# Patient Record
Sex: Male | Born: 1970 | Race: Black or African American | Hispanic: No | Smoking: Never smoker
Health system: Southern US, Community
[De-identification: ages and names within clinical notes are randomized; demographics above are authoritative.]

---

## 1999-05-18 ENCOUNTER — Encounter: Payer: Self-pay | Admitting: Emergency Medicine

## 1999-05-18 ENCOUNTER — Emergency Department (HOSPITAL_COMMUNITY): Admission: EM | Admit: 1999-05-18 | Discharge: 1999-05-18 | Payer: Self-pay | Admitting: Emergency Medicine

## 2001-03-05 ENCOUNTER — Encounter: Payer: Self-pay | Admitting: Internal Medicine

## 2001-03-05 ENCOUNTER — Ambulatory Visit (HOSPITAL_COMMUNITY): Admission: RE | Admit: 2001-03-05 | Discharge: 2001-03-05 | Payer: Self-pay | Admitting: Internal Medicine

## 2009-06-30 ENCOUNTER — Encounter: Admission: RE | Admit: 2009-06-30 | Discharge: 2009-06-30 | Payer: Self-pay | Admitting: Occupational Medicine

## 2009-09-02 ENCOUNTER — Encounter
Admission: RE | Admit: 2009-09-02 | Discharge: 2009-09-02 | Payer: Self-pay | Admitting: Physical Medicine and Rehabilitation

## 2009-09-21 ENCOUNTER — Encounter
Admission: RE | Admit: 2009-09-21 | Discharge: 2009-09-21 | Payer: Self-pay | Admitting: Physical Medicine and Rehabilitation

## 2011-12-30 ENCOUNTER — Other Ambulatory Visit: Payer: Self-pay | Admitting: Internal Medicine

## 2011-12-30 DIAGNOSIS — R109 Unspecified abdominal pain: Secondary | ICD-10-CM

## 2012-01-04 ENCOUNTER — Ambulatory Visit
Admission: RE | Admit: 2012-01-04 | Discharge: 2012-01-04 | Disposition: A | Discharge status: Home / Self Care | Payer: 59 | Source: Ambulatory Visit | Attending: Internal Medicine | Admitting: Internal Medicine

## 2012-01-04 DIAGNOSIS — R109 Unspecified abdominal pain: Secondary | ICD-10-CM

## 2012-01-04 MED ORDER — IOHEXOL 300 MG/ML  SOLN
125.0000 mL | Freq: Once | INTRAMUSCULAR | Status: AC | PRN
  Administered 2012-01-04: 125 mL via INTRAVENOUS

## 2012-03-20 ENCOUNTER — Other Ambulatory Visit (HOSPITAL_COMMUNITY): Payer: Self-pay | Admitting: Internal Medicine

## 2012-03-20 ENCOUNTER — Ambulatory Visit (HOSPITAL_COMMUNITY)
Admission: RE | Admit: 2012-03-20 | Discharge: 2012-03-20 | Disposition: A | Discharge status: Home / Self Care | Payer: 59 | Source: Ambulatory Visit | Attending: Internal Medicine | Admitting: Internal Medicine

## 2012-03-20 DIAGNOSIS — M79609 Pain in unspecified limb: Secondary | ICD-10-CM | POA: Insufficient documentation

## 2012-03-20 DIAGNOSIS — M25579 Pain in unspecified ankle and joints of unspecified foot: Secondary | ICD-10-CM

## 2012-03-20 DIAGNOSIS — M7989 Other specified soft tissue disorders: Secondary | ICD-10-CM | POA: Insufficient documentation

## 2015-01-29 ENCOUNTER — Encounter: Payer: Self-pay | Admitting: Internal Medicine

## 2015-01-29 ENCOUNTER — Ambulatory Visit (INDEPENDENT_AMBULATORY_CARE_PROVIDER_SITE_OTHER): Payer: Self-pay | Admitting: Internal Medicine

## 2015-01-29 VITALS — BP 122/80 | HR 82 | Temp 98.0°F | Resp 18 | Ht 75.0 in | Wt 281.0 lb

## 2015-01-29 DIAGNOSIS — M79604 Pain in right leg: Secondary | ICD-10-CM

## 2015-01-29 DIAGNOSIS — M79605 Pain in left leg: Secondary | ICD-10-CM

## 2015-01-29 LAB — CBC WITH DIFFERENTIAL/PLATELET
BASOS ABS: 0 10*3/uL (ref 0.0–0.1)
Basophils Relative: 0 % (ref 0–1)
Eosinophils Absolute: 0.2 10*3/uL (ref 0.0–0.7)
Eosinophils Relative: 5 % (ref 0–5)
HEMATOCRIT: 46.4 % (ref 39.0–52.0)
Hemoglobin: 15.9 g/dL (ref 13.0–17.0)
Lymphocytes Relative: 37 % (ref 12–46)
Lymphs Abs: 1.4 10*3/uL (ref 0.7–4.0)
MCH: 26.3 pg (ref 26.0–34.0)
MCHC: 34.3 g/dL (ref 30.0–36.0)
MCV: 76.7 fL — ABNORMAL LOW (ref 78.0–100.0)
MPV: 9.9 fL (ref 8.6–12.4)
Monocytes Absolute: 0.4 10*3/uL (ref 0.1–1.0)
Monocytes Relative: 10 % (ref 3–12)
NEUTROS ABS: 1.8 10*3/uL (ref 1.7–7.7)
Neutrophils Relative %: 48 % (ref 43–77)
Platelets: 226 10*3/uL (ref 150–400)
RBC: 6.05 MIL/uL — ABNORMAL HIGH (ref 4.22–5.81)
RDW: 14.1 % (ref 11.5–15.5)
WBC: 3.8 10*3/uL — AB (ref 4.0–10.5)

## 2015-01-29 LAB — BASIC METABOLIC PANEL WITH GFR
BUN: 9 mg/dL (ref 6–23)
CALCIUM: 9.8 mg/dL (ref 8.4–10.5)
CHLORIDE: 104 meq/L (ref 96–112)
CO2: 26 mEq/L (ref 19–32)
CREATININE: 1.15 mg/dL (ref 0.50–1.35)
GFR, EST AFRICAN AMERICAN: 88 mL/min
GFR, EST NON AFRICAN AMERICAN: 76 mL/min
Glucose, Bld: 84 mg/dL (ref 70–99)
Potassium: 4.2 mEq/L (ref 3.5–5.3)
Sodium: 137 mEq/L (ref 135–145)

## 2015-01-29 LAB — MAGNESIUM: Magnesium: 1.9 mg/dL (ref 1.5–2.5)

## 2015-01-29 LAB — IRON AND TIBC
%SAT: 42 % (ref 20–55)
Iron: 123 ug/dL (ref 42–165)
TIBC: 296 ug/dL (ref 215–435)
UIBC: 173 ug/dL (ref 125–400)

## 2015-01-29 MED ORDER — CYCLOBENZAPRINE HCL 5 MG PO TABS: 5.0000 mg | ORAL_TABLET | Freq: Three times a day (TID) | ORAL | Status: AC | PRN

## 2015-01-29 NOTE — Progress Notes (Signed)
Subjective:    Patient ID: Ronald Winters, male    DOB: [birth year], 44 y.o.   MRN: [id number]  Leg Pain  Associated symptoms include numbness (tingling in feet occasionally.).   Patient presents to the office for evaluation of bilateral leg pain from the knees down.  He reports that the pain is like a tightness when he is at rest.  He reports that during exercise it is severe enough that it is prevent him from doing stuff.  He reports at night time it is more like a throbbing sensation.  He reports that he normally plays basketball, but he has tried giving it a break and has not had any relief with taking a break.  He has been on the elliptical machine and has tried walking.  He reports that he has not tried any ice or heat and he has not taken any type of medications to help relieve his pain.  No injury that he can think of.  No surgery, immobilization, history of clot, or estrogen use.      He has not been seen since 2014.    He does not take any medications on a regular basis.    Review of Systems  Constitutional: Negative for fever and chills.  Respiratory: Negative for cough, chest tightness and shortness of breath.   Cardiovascular: Positive for chest pain. Negative for palpitations and leg swelling.  Musculoskeletal: Positive for back pain, arthralgias and gait problem.  Neurological: Positive for numbness (tingling in feet occasionally.).       Objective:   Physical Exam  Constitutional: He is oriented to person, place, and time. He appears well-developed and well-nourished. No distress.  HENT:  Head: Normocephalic.  Mouth/Throat: Oropharynx is clear and moist. No oropharyngeal exudate.  Eyes: Conjunctivae are normal. No scleral icterus.  Neck: Normal range of motion. Neck supple. No JVD present. No thyromegaly present.  Cardiovascular: Normal rate, regular rhythm, normal heart sounds and intact distal pulses.   Pulses:      Dorsalis pedis pulses are 2+ on the right side,  and 2+ on the left side.       Posterior tibial pulses are 2+ on the right side, and 2+ on the left side.  No calf tenderness bilaterally.  Negative holmans sign bilaterally.  Pulmonary/Chest: Effort normal and breath sounds normal.  Lymphadenopathy:    He has no cervical adenopathy.  Neurological: He is alert and oriented to person, place, and time.  Skin: Skin is warm and dry. He is not diaphoretic.  Psychiatric: He has a normal mood and affect. His behavior is normal. Judgment and thought content normal.  Nursing note and vitals reviewed.       Assessment & Plan:    1. Bilateral leg pain -Hx of anemia and no longer on iron.  Question of iron deficiency anemia.  Patient with negative wells criteria.  Doubt currently that this is bilateral dvts.  Will try flexeril until we can see whether this is related to iron deficiency vs. Electrolyte imbalance. Recommended that the patient start taking 250 mg magnesium daily and drink plenty of water.  Less likely claudication at this point as it is painful all the time.  Good pulses on exam as well.  - CBC with Differential/Platelet - BASIC METABOLIC PANEL WITH GFR - Magnesium - Iron and TIBC - cyclobenzaprine (FLEXERIL) 5 MG tablet; Take 1 tablet (5 mg total) by mouth every 8 (eight) hours as needed for muscle spasms.  Dispense: 30 tablet; Refill:  1    

## 2015-01-29 NOTE — Patient Instructions (Signed)

## 2015-01-31 ENCOUNTER — Emergency Department (HOSPITAL_COMMUNITY): Payer: Self-pay

## 2015-01-31 ENCOUNTER — Emergency Department (HOSPITAL_COMMUNITY)
Admission: EM | Admit: 2015-01-31 | Discharge: 2015-01-31 | Disposition: A | Discharge status: Home / Self Care | Payer: Self-pay | Attending: Emergency Medicine | Admitting: Emergency Medicine

## 2015-01-31 ENCOUNTER — Encounter (HOSPITAL_COMMUNITY): Payer: Self-pay | Admitting: *Deleted

## 2015-01-31 DIAGNOSIS — M25519 Pain in unspecified shoulder: Secondary | ICD-10-CM

## 2015-01-31 DIAGNOSIS — R61 Generalized hyperhidrosis: Secondary | ICD-10-CM | POA: Insufficient documentation

## 2015-01-31 DIAGNOSIS — M79604 Pain in right leg: Secondary | ICD-10-CM | POA: Insufficient documentation

## 2015-01-31 DIAGNOSIS — M25512 Pain in left shoulder: Secondary | ICD-10-CM | POA: Insufficient documentation

## 2015-01-31 DIAGNOSIS — M79605 Pain in left leg: Secondary | ICD-10-CM | POA: Insufficient documentation

## 2015-01-31 DIAGNOSIS — R079 Chest pain, unspecified: Secondary | ICD-10-CM

## 2015-01-31 DIAGNOSIS — R110 Nausea: Secondary | ICD-10-CM | POA: Insufficient documentation

## 2015-01-31 LAB — CBC
HEMATOCRIT: 47.5 % (ref 39.0–52.0)
HEMOGLOBIN: 15.9 g/dL (ref 13.0–17.0)
MCH: 26.4 pg (ref 26.0–34.0)
MCHC: 33.5 g/dL (ref 30.0–36.0)
MCV: 78.8 fL (ref 78.0–100.0)
Platelets: 196 10*3/uL (ref 150–400)
RBC: 6.03 MIL/uL — ABNORMAL HIGH (ref 4.22–5.81)
RDW: 13.1 % (ref 11.5–15.5)
WBC: 4.4 10*3/uL (ref 4.0–10.5)

## 2015-01-31 LAB — I-STAT TROPONIN, ED
Troponin i, poc: 0 ng/mL (ref 0.00–0.08)
Troponin i, poc: 0 ng/mL (ref 0.00–0.08)

## 2015-01-31 LAB — BASIC METABOLIC PANEL
ANION GAP: 9 (ref 5–15)
BUN: 10 mg/dL (ref 6–20)
CHLORIDE: 106 mmol/L (ref 101–111)
CO2: 24 mmol/L (ref 22–32)
CREATININE: 1.23 mg/dL (ref 0.61–1.24)
Calcium: 9.6 mg/dL (ref 8.9–10.3)
GFR calc non Af Amer: >60 mL/min (ref >60)
Glucose, Bld: 113 mg/dL — ABNORMAL HIGH (ref 65–99)
POTASSIUM: 4.1 mmol/L (ref 3.5–5.1)
Sodium: 139 mmol/L (ref 135–145)

## 2015-01-31 LAB — D-DIMER, QUANTITATIVE: D-Dimer, Quant: 0.45 ug/mL-FEU (ref 0.00–0.48)

## 2015-01-31 MED ORDER — HYDROCODONE-ACETAMINOPHEN 5-325 MG PO TABS
2.0000 | ORAL_TABLET | Freq: Once | ORAL | Status: DC
  Filled 2015-01-31: qty 2

## 2015-01-31 MED ORDER — HYDROCODONE-ACETAMINOPHEN 5-325 MG PO TABS: 1.0000 | ORAL_TABLET | Freq: Four times a day (QID) | ORAL | Status: DC | PRN

## 2015-01-31 NOTE — ED Notes (Signed)
Pt reports multiple complaints, reports bilateral calf pain, tingling to feet and occ chest pains for over the past month. Today is having shoulder pain and nausea.

## 2015-01-31 NOTE — Discharge Instructions (Signed)
Acromioclavicular Injuries °The AC (acromioclavicular) joint is the joint in the shoulder where the collarbone (clavicle) meets the shoulder blade (scapula). The part of the shoulder blade connected to the collarbone is called the acromion. Common problems with and treatments for the AC joint are detailed below. °ARTHRITIS °Arthritis occurs when the joint has been injured and the smooth padding between the joints (cartilage) is lost. This is the wear and tear seen in most joints of the body if they have been overused. This causes the joint to produce pain and swelling which is worse with activity.  °AC JOINT SEPARATION °AC joint separation means that the ligaments connecting the acromion of the shoulder blade and collarbone have been damaged, and the two bones no longer line up. AC separations can be anywhere from mild to severe, and are "graded" depending upon which ligaments are torn and how badly they are torn. °· Grade I Injury: the least damage is done, and the AC joint still lines up. °· Grade II Injury: damage to the ligaments which reinforce the AC joint. In a Grade II injury, these ligaments are stretched but not entirely torn. When stressed, the AC joint becomes painful and unstable. °· Grade III Injury: AC and secondary ligaments are completely torn, and the collarbone is no longer attached to the shoulder blade. This results in deformity; a prominence of the end of the clavicle. °AC JOINT FRACTURE °AC joint fracture means that there has been a break in the bones of the AC joint, usually the end of the clavicle. °TREATMENT °TREATMENT OF AC ARTHRITIS °· There is currently no way to replace the cartilage damaged by arthritis. The best way to improve the condition is to decrease the activities which aggravate the problem. Application of ice to the joint helps decrease pain and soreness (inflammation). The use of non-steroidal anti-inflammatory medication is helpful. °· If less conservative measures do not  work, then cortisone shots (injections) may be used. These are anti-inflammatories; they decrease the soreness in the joint and swelling. °· If non-surgical measures fail, surgery may be recommended. The procedure is generally removal of a portion of the end of the clavicle. This is the part of the collarbone closest to your acromion which is stabilized with ligaments to the acromion of the shoulder blade. This surgery may be performed using a tube-like instrument with a light (arthroscope) for looking into a joint. It may also be performed as an open surgery through a small incision by the surgeon. Most patients will have good range of motion within 6 weeks and may return to all activity including sports by 8-12 weeks, barring complications. °TREATMENT OF AN AC SEPARATION °· The initial treatment is to decrease pain. This is best accomplished by immobilizing the arm in a sling and placing an ice pack to the shoulder for 20 to 30 minutes every 2 hours as needed. As the pain starts to subside, it is important to begin moving the fingers, wrist, elbow and eventually the shoulder in order to prevent a stiff or "frozen" shoulder. Instruction on when and how much to move the shoulder will be provided by your caregiver. The length of time needed to regain full motion and function depends on the amount or grade of the injury. Recovery from a Grade I AC separation usually takes 10 to 14 days, whereas a Grade III may take 6 to 8 weeks. °· Grade I and II separations usually do not require surgery. Even Grade III injuries usually allow return to full   activity with few restrictions. Treatment is also based on the activity demands of the injured shoulder. For example, a high level quarterback with an injured throwing arm will receive more aggressive treatment than someone with a desk job who rarely uses his/her arm for strenuous activities. In some cases, a painful lump may persist which could require a later surgery. Surgery  can be very successful, but the benefits must be weighed against the potential risks. TREATMENT OF AN AC JOINT FRACTURE Fracture treatment depends on the type of fracture. Sometimes a splint or sling may be all that is required. Other times surgery may be required for repair. This is more frequently the case when the ligaments supporting the clavicle are completely torn. Your caregiver will help you with these decisions and together you can decide what will be the best treatment. HOME CARE INSTRUCTIONS   Apply ice to the injury for 15-20 minutes each hour while awake for 2 days. Put the ice in a plastic bag and place a towel between the bag of ice and skin.  If a sling has been applied, wear it constantly for as long as directed by your caregiver, even at night. The sling or splint can be removed for bathing or showering or as directed. Be sure to keep the shoulder in the same place as when the sling is on. Do not lift the arm.  If a figure-of-eight splint has been applied it should be tightened gently by another person every day. Tighten it enough to keep the shoulders held back. Allow enough room to place the index finger between the body and strap. Loosen the splint immediately if there is numbness or tingling in the hands.  Take over-the-counter or prescription medicines for pain, discomfort or fever as directed by your caregiver.  If you or your child has received a follow up appointment, it is very important to keep that appointment in order to avoid long term complications, chronic pain or disability. SEEK MEDICAL CARE IF:   The pain is not relieved with medications.  There is increased swelling or discoloration that continues to get worse rather than better.  You or your child has been unable to follow up as instructed.  There is progressive numbness and tingling in the arm, forearm or hand. SEEK IMMEDIATE MEDICAL CARE IF:   The arm is numb, cold or pale.  There is increasing pain  in the hand, forearm or fingers. MAKE SURE YOU:   Understand these instructions.  Will watch your condition.  Will get help right away if you are not doing well or get worse. Document Released: 04/13/2005 Document Revised: 09/26/2011 Document Reviewed: 10/06/2008 [facility] Patient Information 2015 [place], [place]. This information is not intended to replace advice given to you by your health care provider. Make sure you discuss any questions you have with your health care provider. Chest Pain (Nonspecific) It is often hard to give a specific diagnosis for the cause of chest pain. There is always a chance that your pain could be related to something serious, such as a heart attack or a blood clot in the lungs. You need to follow up with your health care provider for further evaluation. CAUSES   Heartburn.  Pneumonia or bronchitis.  Anxiety or stress.  Inflammation around your heart (pericarditis) or lung (pleuritis or pleurisy).  A blood clot in the lung.  A collapsed lung (pneumothorax). It can develop suddenly on its own (spontaneous pneumothorax) or from trauma to the chest.  Shingles infection (herpes  zoster virus). The chest wall is composed of bones, muscles, and cartilage. Any of these can be the source of the pain.  The bones can be bruised by injury.  The muscles or cartilage can be strained by coughing or overwork.  The cartilage can be affected by inflammation and become sore (costochondritis). DIAGNOSIS  Lab tests or other studies may be needed to find the cause of your pain. Your health care provider may have you take a test called an ambulatory electrocardiogram (ECG). An ECG records your heartbeat patterns over a 24-hour period. You may also have other tests, such as:  Transthoracic echocardiogram (TTE). During echocardiography, sound waves are used to evaluate how blood flows through your heart.  Transesophageal echocardiogram (TEE).  Cardiac monitoring. This  allows your health care provider to monitor your heart rate and rhythm in real time.  Holter monitor. This is a portable device that records your heartbeat and can help diagnose heart arrhythmias. It allows your health care provider to track your heart activity for several days, if needed.  Stress tests by exercise or by giving medicine that makes the heart beat faster. TREATMENT   Treatment depends on what may be causing your chest pain. Treatment may include:  Acid blockers for heartburn.  Anti-inflammatory medicine.  Pain medicine for inflammatory conditions.  Antibiotics if an infection is present.  You may be advised to change lifestyle habits. This includes stopping smoking and avoiding alcohol, caffeine, and chocolate.  You may be advised to keep your head raised (elevated) when sleeping. This reduces the chance of acid going backward from your stomach into your esophagus. Most of the time, nonspecific chest pain will improve within 2-3 days with rest and mild pain medicine.  HOME CARE INSTRUCTIONS   If antibiotics were prescribed, take them as directed. Finish them even if you start to feel better.  For the next few days, avoid physical activities that bring on chest pain. Continue physical activities as directed.  Do not use any tobacco products, including cigarettes, chewing tobacco, or electronic cigarettes.  Avoid drinking alcohol.  Only take medicine as directed by your health care provider.  Follow your health care provider's suggestions for further testing if your chest pain does not go away.  Keep any follow-up appointments you made. If you do not go to an appointment, you could develop lasting (chronic) problems with pain. If there is any problem keeping an appointment, call to reschedule. SEEK MEDICAL CARE IF:   Your chest pain does not go away, even after treatment.  You have a rash with blisters on your chest.  You have a fever. SEEK IMMEDIATE MEDICAL  CARE IF:   You have increased chest pain or pain that spreads to your arm, neck, jaw, back, or abdomen.  You have shortness of breath.  You have an increasing cough, or you cough up blood.  You have severe back or abdominal pain.  You feel nauseous or vomit.  You have severe weakness.  You faint.  You have chills. This is an emergency. Do not wait to see if the pain will go away. Get medical help at once. Call your local emergency services (911 in [place] Do not drive yourself to the hospital. MAKE SURE YOU:   Understand these instructions.  Will watch your condition.  Will get help right away if you are not doing well or get worse. Document Released: 04/13/2005 Document Revised: 07/09/2013 Document Reviewed: 02/07/2008 [facility] Patient Information 2015 [place], [place]. This information is not  intended to replace advice given to you by your health care provider. Make sure you discuss any questions you have with your health care provider. ° °

## 2015-01-31 NOTE — ED Notes (Signed)
Pt reporting intermittent tingling to bilateral feet and swollen knees for the past several months. Pt also endorses that his elbows have "started doing the same thing" Pt reports intermittent CP x 1 month with nausea and diaphoresis starting today. Pt in no CP at this time.

## 2015-01-31 NOTE — ED Provider Notes (Signed)
CSN: [id number]     Arrival date & time 01/31/15  1336 History   First MD Initiated Contact with Patient 01/31/15 1500     Chief Complaint  Patient presents with  . Chest Pain  . Leg Pain     (Consider location/radiation/quality/duration/timing/severity/associated sxs/prior Treatment) HPI Comments: Patient presents to the emergency department with multiple complaints.  1. Patient complains of intermittent chest pain times several months. He denies any aggravating or alleviating factors. Denies any exertional symptoms. Denies any associated shortness of breath. States that he has had some associated nausea and diaphoresis which started today. He has no history of heart problems. Denies any history of DVT or PE. He states that he has had some bilateral calf pain, and reports a recent trip to [place]. He states that he did have some calf pain prior to his vacation.  2. Patient complaints of left shoulder pain. States he feels like his left shoulder popped out of socket today. States that he was lifting his groceries when this happened. States that he felt it pop back in. He states the pain is moderate. It is tender to palpation.  The history is provided by the patient. No language interpreter was used.    History reviewed. No pertinent past medical history. History reviewed. No pertinent past surgical history. History reviewed. No pertinent family history. History  Substance Use Topics  . Smoking status: Never Smoker   . Smokeless tobacco: Not on file  . Alcohol Use: No    Review of Systems  Constitutional: Negative for fever and chills.  Respiratory: Negative for shortness of breath.   Cardiovascular: Positive for chest pain.  Gastrointestinal: Positive for nausea. Negative for vomiting, diarrhea and constipation.  Genitourinary: Negative for dysuria.  Musculoskeletal: Positive for myalgias and arthralgias.  All other systems reviewed and are negative.     Allergies   Review of patient's allergies indicates no known allergies.  Home Medications   Prior to Admission medications   Medication Sig Start Date End Date Taking? Authorizing Provider  cyclobenzaprine (FLEXERIL) 5 MG tablet Take 1 tablet (5 mg total) by mouth every 8 (eight) hours as needed for muscle spasms. 01/29/15 01/29/16  Courtney Forcucci, PA-C   BP 142/84 mmHg  Pulse 97  Temp(Src) 98.7 F (37.1 C) (Oral)  Resp 20  Ht 6\' 3"  (1.905 m)  SpO2 95% Physical Exam  Constitutional: He is oriented to person, place, and time. He appears well-developed and well-nourished.  HENT:  Head: Normocephalic and atraumatic.  Eyes: Conjunctivae and EOM are normal. Pupils are equal, round, and reactive to light. Right eye exhibits no discharge. Left eye exhibits no discharge. No scleral icterus.  Neck: Normal range of motion. Neck supple. No JVD present.  Cardiovascular: Normal rate, regular rhythm and normal heart sounds.  Exam reveals no gallop and no friction rub.   No murmur heard. Pulmonary/Chest: Effort normal and breath sounds normal. No respiratory distress. He has no wheezes. He has no rales. He exhibits no tenderness.  Abdominal: Soft. He exhibits no distension and no mass. There is no tenderness. There is no rebound and no guarding.  Musculoskeletal: Normal range of motion. He exhibits no edema or tenderness.  Neurological: He is alert and oriented to person, place, and time.  Skin: Skin is warm and dry.  Psychiatric: He has a normal mood and affect. His behavior is normal. Judgment and thought content normal.  Nursing note and vitals reviewed.   ED Course  Procedures (including critical care time) Labs  Review Labs Reviewed  BASIC METABOLIC PANEL - Abnormal; Notable for the following:    Glucose, Bld 113 (*)    All other components within normal limits  CBC - Abnormal; Notable for the following:    RBC 6.03 (*)    All other components within normal limits  D-DIMER, QUANTITATIVE (NOT AT  [facility])  Randolm Idol, ED    Imaging Review Dg Chest 2 View  01/31/2015   CLINICAL DATA:  44 year old male with left chest pain intermittently for the past 2 months.  EXAM: CHEST  2 VIEW  COMPARISON:  None.  FINDINGS: The lungs are clear and negative for focal airspace consolidation, pulmonary edema or suspicious pulmonary nodule. No pleural effusion or pneumothorax. Cardiac and mediastinal contours are within normal limits. No acute fracture or lytic or blastic osseous lesions. Dextro convex scoliosis of the thoracic spine centered at T8-T9. The visualized upper abdominal bowel gas pattern is unremarkable.  IMPRESSION: Negative chest x-ray.   Electronically Signed   By: Jacqulynn Cadet M.D.   On: 01/31/2015 14:54     EKG Interpretation   Date/Time:  Saturday January 31 2015 13:49:17 EDT Ventricular Rate:  101 PR Interval:  208 QRS Duration: 90 QT Interval:  314 QTC Calculation: 407 R Axis:   61 Text Interpretation:  Sinus tachycardia Nonspecific T wave abnormality  Abnormal ECG No previous ECGs available Confirmed by Christy Gentles  MD, Elenore Rota  [phone]) on 01/31/2015 2:48:11 PM      MDM   Final diagnoses:  Shoulder pain  Chest pain, unspecified chest pain type    Patient with left shoulder pain. No mechanism of injury. Plain films are negative. Will give shoulder sling, and recommend orthopedic follow-up. He is also had intermittent chest pain times several months. 2 troponins are negative today, he has negative d-dimer. He does have nonspecific T-wave changes, which were discussed with patient, and the patient states that he has cardiology follow-up being arranged by his primary care provider. Patient is chest pain-free. He does not have any shortness breath. Will discharge to home with primary care follow-up. Patient is stable and ready for discharge.    Montine Circle, PA-C 01/31/15 [place], DO 02/03/15 2210

## 2015-02-02 ENCOUNTER — Telehealth: Payer: Self-pay | Admitting: *Deleted

## 2015-02-02 NOTE — Telephone Encounter (Signed)
Patient called with Ronald Winters that he went to the ED 01/31/15.  He states he was lifting his groceries in to his car and felt his left shoulder "pop."  Patient states he was concerned that he maybe needs to see an Ortho doc, however, patient does not have insurance currently.  He is requesting to talk to you.

## 2015-02-03 ENCOUNTER — Telehealth: Payer: Self-pay | Admitting: Internal Medicine

## 2015-02-03 ENCOUNTER — Other Ambulatory Visit: Payer: Self-pay | Admitting: Internal Medicine

## 2015-02-03 DIAGNOSIS — R9431 Abnormal electrocardiogram [ECG] [EKG]: Secondary | ICD-10-CM

## 2015-02-03 DIAGNOSIS — M25512 Pain in left shoulder: Secondary | ICD-10-CM

## 2015-02-03 MED ORDER — PREDNISONE 20 MG PO TABS: ORAL_TABLET | ORAL | Status: DC

## 2015-02-03 NOTE — Telephone Encounter (Signed)
Called patient back regarding call that he made yesterday. Patient was concerned about his recent visit to the ER.  Patient did have some chest pain and left shoulder pain which made him concerned and he went to be evaluated.  Two cardiac enzymes were normal, and there were no abnormalities on labs or chest xray.  Patient did have some minor t wave abnormalities that were non-specific but no prior ekgs were available to compare to.  Patient complains that shoulder is still stiff and bothering him.  He occasionally has a burning sensation to the arm.  Patient did have normal xray and was given a sling.  He feels that it is doing better.  I have discussed that this may possibly be an inflamed nerve root in his neck given radiculopathy like symptoms.  Will try course of prednisone.  If no relief patient to be seen in the office.  Will send in referral to cardiology.

## 2015-02-14 ENCOUNTER — Encounter (HOSPITAL_COMMUNITY): Payer: Self-pay | Admitting: Emergency Medicine

## 2015-02-14 ENCOUNTER — Emergency Department (HOSPITAL_COMMUNITY): Payer: Self-pay

## 2015-02-14 ENCOUNTER — Emergency Department (HOSPITAL_COMMUNITY)
Admission: EM | Admit: 2015-02-14 | Discharge: 2015-02-15 | Disposition: A | Discharge status: Home / Self Care | Payer: Self-pay | Attending: Emergency Medicine | Admitting: Emergency Medicine

## 2015-02-14 DIAGNOSIS — Z79899 Other long term (current) drug therapy: Secondary | ICD-10-CM | POA: Insufficient documentation

## 2015-02-14 DIAGNOSIS — R079 Chest pain, unspecified: Secondary | ICD-10-CM | POA: Insufficient documentation

## 2015-02-14 LAB — BASIC METABOLIC PANEL
Anion gap: 6 (ref 5–15)
BUN: 10 mg/dL (ref 6–20)
CALCIUM: 9.3 mg/dL (ref 8.9–10.3)
CO2: 26 mmol/L (ref 22–32)
Chloride: 104 mmol/L (ref 101–111)
Creatinine, Ser: 1.24 mg/dL (ref 0.61–1.24)
GFR calc non Af Amer: >60 mL/min (ref >60)
GLUCOSE: 115 mg/dL — AB (ref 65–99)
Potassium: 3.9 mmol/L (ref 3.5–5.1)
Sodium: 136 mmol/L (ref 135–145)

## 2015-02-14 LAB — I-STAT TROPONIN, ED: TROPONIN I, POC: 0 ng/mL (ref 0.00–0.08)

## 2015-02-14 LAB — CBC
HEMATOCRIT: 45.7 % (ref 39.0–52.0)
Hemoglobin: 15.5 g/dL (ref 13.0–17.0)
MCH: 26.5 pg (ref 26.0–34.0)
MCHC: 33.9 g/dL (ref 30.0–36.0)
MCV: 78.1 fL (ref 78.0–100.0)
Platelets: 224 10*3/uL (ref 150–400)
RBC: 5.85 MIL/uL — ABNORMAL HIGH (ref 4.22–5.81)
RDW: 12.9 % (ref 11.5–15.5)
WBC: 5.5 10*3/uL (ref 4.0–10.5)

## 2015-02-14 MED ORDER — GI COCKTAIL ~~LOC~~
30.0000 mL | Freq: Once | ORAL | Status: AC
  Administered 2015-02-15: 30 mL via ORAL
  Filled 2015-02-14: qty 30

## 2015-02-14 NOTE — ED Provider Notes (Signed)
CSN: [id number]   Arrival date & time 02/14/15 2249  History  This chart was scribed for  Ronald Freiberg, MD by Altamease Oiler, ED Scribe. This patient was seen in room D31C/D31C and the patient's care was started at 11:45 PM.  Chief Complaint  Patient presents with  . Chest Pain    HPI The history is provided by the patient. No language interpreter was used.   Ronald Winters is a 44 y.o. male who presents to the Emergency Department complaining of an episode of chest pain with onset 1.5 hours ago while in bed. The pain is described as pressure with no modifying factors. The episode lasted for 15-30 minutes. Pt is pain free at present.  He had similar pain 2 weeks ago and notes that he is working on cardiology follow up. No nausea,vomiting, SOB, cough, congestion, sore throat, diarrhea, constipation, or fever. His father had cardiac disease before age 65. No recent surgery or travel.   History reviewed. No pertinent past medical history.  History reviewed. No pertinent past surgical history.  No family history on file.  History  Substance Use Topics  . Smoking status: Never Smoker   . Smokeless tobacco: Not on file  . Alcohol Use: No     Review of Systems  Constitutional: Negative for fever.  HENT: Negative for congestion.   Respiratory: Negative for cough and shortness of breath.   Cardiovascular: Positive for chest pain. Negative for leg swelling.  Gastrointestinal: Negative for nausea, vomiting, diarrhea and constipation.  All other systems reviewed and are negative.    Home Medications   Prior to Admission medications   Medication Sig Start Date End Date Taking? Authorizing Provider  cyclobenzaprine (FLEXERIL) 5 MG tablet Take 1 tablet (5 mg total) by mouth every 8 (eight) hours as needed for muscle spasms. Patient not taking: Reported on 02/14/2015 01/29/15 01/29/16  Starlyn Skeans, PA-C  HYDROcodone-acetaminophen (NORCO/VICODIN) 5-325 MG per tablet Take 1-2 tablets by  mouth every 6 (six) hours as needed. Patient not taking: Reported on 02/14/2015 01/31/15   Montine Circle, PA-C  predniSONE (DELTASONE) 20 MG tablet 3 tabs po day one, then 2 tabs daily x 4 days Patient not taking: Reported on 02/14/2015 02/03/15   Starlyn Skeans, PA-C    Allergies  Review of patient's allergies indicates no known allergies.  Triage Vitals: BP 126/83 mmHg  Pulse 81  Temp(Src) 98.6 F (37 C) (Oral)  Resp 16  SpO2 95%  Physical Exam  Constitutional: He is oriented to person, place, and time. He appears well-developed and well-nourished.  HENT:  Head: Normocephalic and atraumatic.  Eyes: Conjunctivae and EOM are normal.  Neck: Normal range of motion. Neck supple.  Cardiovascular: Normal rate, regular rhythm and normal heart sounds.   Pulmonary/Chest: Effort normal and breath sounds normal. No respiratory distress.  Abdominal: He exhibits no distension. There is no tenderness. There is no rebound and no guarding.  Musculoskeletal: Normal range of motion.  Neurological: He is alert and oriented to person, place, and time.  Skin: Skin is warm and dry.  Vitals reviewed.   ED Course  Procedures   DIAGNOSTIC STUDIES: Oxygen Saturation is 95% on RA, normal by my interpretation.    COORDINATION OF CARE: 11:49 PM Discussed treatment plan which includes CXR, EKG, lab work, and GI cocktail with pt at bedside and pt agreed to plan.  Labs Review-  Labs Reviewed  BASIC METABOLIC PANEL - Abnormal; Notable for the following:    Glucose, Bld 115 (*)  All other components within normal limits  CBC - Abnormal; Notable for the following:    RBC 5.85 (*)    All other components within normal limits  I-STAT TROPOININ, ED  I-STAT TROPOININ, ED    Imaging Review Dg Chest 2 View  02/14/2015   CLINICAL DATA:  Chest pressure on the left for 1 hr. Initial encounter  EXAM: CHEST  2 VIEW  COMPARISON:  01/31/2015  FINDINGS: The heart size and mediastinal contours are within  normal limits. Both lungs are clear. The visualized skeletal structures are unremarkable.  IMPRESSION: No active cardiopulmonary disease.   Electronically Signed   By: Andreas Newport M.D.   On: 02/14/2015 23:40    EKG Interpretation  Date/Time:  Saturday February 14 2015 22:53:33 EDT Ventricular Rate:  84 PR Interval:  208 QRS Duration: 100 QT Interval:  350 QTC Calculation: 413 R Axis:   55 Text Interpretation:  Normal sinus rhythm Nonspecific T wave abnormality Abnormal ECG No significant change since last tracing Confirmed by Ronald Winters [phone]) on 02/14/2015 11:36:09 PM       MDM   Final diagnoses:  Chest pain, unspecified chest pain type     44 y.o. male without pertinent PMH presents with recurrent chest pain as above. Patient was seen recently for similar symptoms, had unremarkable workup including d-dimer. On arrival today the patient has vital signs and physical exam as above. He is pain-free at time of my exam.  He states he is probably just anxious. No history of anxiety or other history. Workup as above unremarkable, no indication that this is PE, pain not pleuritic, patient perc negative. As the patient is a negative delta troponin, is pain-free, consider his discharge to follow-up with cardiology result. He was given standard return precautions, voiced understanding and agreed to follow-up.  I have reviewed all laboratory and imaging studies if ordered as above  1. Chest pain, unspecified chest pain type          Ronald Freiberg, MD 02/15/15 [phone]

## 2015-02-14 NOTE — ED Notes (Addendum)
Pt. reports mid/left chest pressure onset this evening , denies SOB , no nausea or diaphoresis , no cough or congestion .

## 2015-02-15 LAB — I-STAT TROPONIN, ED: Troponin i, poc: 0 ng/mL (ref 0.00–0.08)

## 2015-02-15 NOTE — ED Notes (Signed)
The pt reports that he feels more relaxed.  The chest tightness has gone away

## 2015-02-15 NOTE — ED Notes (Signed)
The pt has no  Chest discomfort.  He feels like his stomach is full.  He would also like a snack.. Waiting for lab results

## 2015-02-15 NOTE — Discharge Instructions (Signed)

## 2015-02-15 NOTE — ED Notes (Signed)
The pt has had  Some chest  Discomfort .  Pressure like  Pain and lt shoulder pain

## 2015-02-16 ENCOUNTER — Telehealth: Payer: Self-pay | Admitting: Internal Medicine

## 2015-02-16 NOTE — Telephone Encounter (Signed)
Wife of patient Ronald Winters called with concerns that patient is depressed and is concerned that the patient is not verbalizing his feelings and is wanting advice.  She reports that her husband has gone to the ER again this weekend and that he has been having some chest pain and chest pressure.  He had negative cardiac enzymes and also had normal EKG and blood work.  She reports that the ER doctor said that it is either anxiety or reflux that is causing chest pressure or chest pain.  She reports that she is concerned that part of this may be related to anxiety versus depression.  She reports that the patient has not been getting up and answering the door for both her or his uncle.  She reports that he is currently separated from her.  She reports that he has been a very poor historian.  Have encouraged her to come to an appointment with him.  Will call patient later this afternoon.

## 2015-02-20 ENCOUNTER — Telehealth: Payer: Self-pay | Admitting: Internal Medicine

## 2015-02-20 NOTE — Telephone Encounter (Signed)
New Message  This message is to inform you that we have made 3 consecutive attempts to contact the patient since 02/03/2015. We have also mailed a letter to the patient to inform them to call in and schedule. Although we were unsuccessful in these attempts we wanted you to be aware of our efforts. Will remove the patient from our referral work que at this time.    [facility]

## 2016-04-26 ENCOUNTER — Encounter: Payer: Self-pay | Admitting: Internal Medicine

## 2016-04-26 ENCOUNTER — Ambulatory Visit (INDEPENDENT_AMBULATORY_CARE_PROVIDER_SITE_OTHER): Payer: Self-pay | Admitting: Internal Medicine

## 2016-04-26 VITALS — BP 124/84 | HR 80 | Temp 97.3°F | Resp 16 | Ht 75.0 in | Wt 286.2 lb

## 2016-04-26 DIAGNOSIS — R030 Elevated blood-pressure reading, without diagnosis of hypertension: Secondary | ICD-10-CM

## 2016-04-26 DIAGNOSIS — R7309 Other abnormal glucose: Secondary | ICD-10-CM

## 2016-04-26 DIAGNOSIS — E559 Vitamin D deficiency, unspecified: Secondary | ICD-10-CM | POA: Insufficient documentation

## 2016-04-26 DIAGNOSIS — E789 Disorder of lipoprotein metabolism, unspecified: Secondary | ICD-10-CM

## 2016-04-26 DIAGNOSIS — R5383 Other fatigue: Secondary | ICD-10-CM | POA: Insufficient documentation

## 2016-04-26 DIAGNOSIS — Z0001 Encounter for general adult medical examination with abnormal findings: Secondary | ICD-10-CM | POA: Insufficient documentation

## 2016-04-26 DIAGNOSIS — Z136 Encounter for screening for cardiovascular disorders: Secondary | ICD-10-CM

## 2016-04-26 LAB — LIPID PANEL
CHOL/HDL RATIO: 4.4 ratio (ref <=5.0)
CHOLESTEROL: 182 mg/dL (ref 125–200)
HDL: 41 mg/dL (ref >=40)
LDL CALC: 116 mg/dL (ref <130)
TRIGLYCERIDES: 125 mg/dL (ref <150)
VLDL: 25 mg/dL (ref <30)

## 2016-04-26 NOTE — Patient Instructions (Addendum)
"[facility]" - Ronald Winters  +++++++++++++++++++++++++++++++ reventive Care for Adults  A healthy lifestyle and preventive care can promote health and wellness. Preventive health guidelines for men include the following key practices:  A routine yearly physical is a good way to check with your health care provider about your health and preventative screening. It is a chance to share any concerns and updates on your health and to receive a thorough exam.  Visit your dentist for a routine exam and preventative care every 6 months. Brush your teeth twice a day and floss once a day. Good oral hygiene prevents tooth decay and gum disease.  The frequency of eye exams is based on your age, health, family medical history, use of contact lenses, and other factors. Follow your health care provider's recommendations for frequency of eye exams.  Eat a healthy diet. Foods such as vegetables, fruits, whole grains, low-fat dairy products, and lean protein foods contain the nutrients you need without too many calories. Decrease your intake of foods high in solid fats, added sugars, and salt. Eat the right amount of calories for you.Get information about a proper diet from your health care provider, if necessary.  Regular physical exercise is one of the most important things you can do for your health. Most adults should get at least 150 minutes of moderate-intensity exercise (any activity that increases your heart rate and causes you to sweat) each week. In addition, most adults need muscle-strengthening exercises on 2 or more days a week.  Maintain a healthy weight. The body mass index (BMI) is a screening tool to identify possible weight problems. It provides an estimate of body fat based on height and weight. Your health care provider can find your BMI and can help you achieve or maintain a healthy weight.For adults 20 years and older:  A BMI below 18.5 is considered underweight.  A BMI of 18.5 to 24.9  is normal.  A BMI of 25 to 29.9 is considered overweight.  A BMI of 30 and above is considered obese.  Maintain normal blood lipids and cholesterol levels by exercising and minimizing your intake of saturated fat. Eat a balanced diet with plenty of fruit and vegetables. Blood tests for lipids and cholesterol should begin at age 62 and be repeated every 5 years. If your lipid or cholesterol levels are high, you are over 50, or you are at high risk for heart disease, you may need your cholesterol levels checked more frequently.Ongoing high lipid and cholesterol levels should be treated with medicines if diet and exercise are not working.  If you smoke, find out from your health care provider how to quit. If you do not use tobacco, do not start.  Lung cancer screening is recommended for adults aged 24-80 years who are at high risk for developing lung cancer because of a history of smoking. A yearly low-dose CT scan of the lungs is recommended for people who have at least a 30-pack-year history of smoking and are a current smoker or have quit within the past 15 years. A pack year of smoking is smoking an average of 1 pack of cigarettes a day for 1 year (for example: 1 pack a day for 30 years or 2 packs a day for 15 years). Yearly screening should continue until the smoker has stopped smoking for at least 15 years. Yearly screening should be stopped for people who develop a health problem that would prevent them from having lung cancer treatment.  If you  choose to drink alcohol, do not have more than 2 drinks per day. One drink is considered to be 12 ounces (355 mL) of beer, 5 ounces (148 mL) of wine, or 1.5 ounces (44 mL) of liquor.  Avoid use of street drugs. Do not share needles with anyone. Ask for help if you need support or instructions about stopping the use of drugs.  High blood pressure causes heart disease and increases the risk of stroke. Your blood pressure should be checked at least every  1-2 years. Ongoing high blood pressure should be treated with medicines, if weight loss and exercise are not effective.  If you are 63-58 years old, ask your health care provider if you should take aspirin to prevent heart disease.  Diabetes screening involves taking a blood sample to check your fasting blood sugar level. This should be done once every 3 years, after age 57, if you are within normal weight and without risk factors for diabetes. Testing should be considered at a younger age or be carried out more frequently if you are overweight and have at least 1 risk factor for diabetes.  Colorectal cancer can be detected and often prevented. Most routine colorectal cancer screening begins at the age of 51 and continues through age 8. However, your health care provider may recommend screening at an earlier age if you have risk factors for colon cancer. On a yearly basis, your health care provider may provide home test kits to check for hidden blood in the stool. Use of a small camera at the end of a tube to directly examine the colon (sigmoidoscopy or colonoscopy) can detect the earliest forms of colorectal cancer. Talk to your health care provider about this at age 32, when routine screening begins. Direct exam of the colon should be repeated every 5-10 years through age 55, unless early forms of precancerous polyps or small growths are found.   Talk with your health care provider about prostate cancer screening.  Testicular cancer screening isrecommended for adult males. Screening includes self-exam, a health care provider exam, and other screening tests. Consult with your health care provider about any symptoms you have or any concerns you have about testicular cancer.  Use sunscreen. Apply sunscreen liberally and repeatedly throughout the day. You should seek shade when your shadow is shorter than you. Protect yourself by wearing long sleeves, pants, a wide-brimmed hat, and sunglasses year round,  whenever you are outdoors.  Once a month, do a whole-body skin exam, using a mirror to look at the skin on your back. Tell your health care provider about new moles, moles that have irregular borders, moles that are larger than a pencil eraser, or moles that have changed in shape or color.  Stay current with required vaccines (immunizations).  Influenza vaccine. All adults should be immunized every year.  Tetanus, diphtheria, and acellular pertussis (Td, Tdap) vaccine. An adult who has not previously received Tdap or who does not know his vaccine status should receive 1 dose of Tdap. This initial dose should be followed by tetanus and diphtheria toxoids (Td) booster doses every 10 years. Adults with an unknown or incomplete history of completing a 3-dose immunization series with Td-containing vaccines should begin or complete a primary immunization series including a Tdap dose. Adults should receive a Td booster every 10 years.  Varicella vaccine. An adult without evidence of immunity to varicella should receive 2 doses or a second dose if he has previously received 1 dose.  Human papillomavirus (HPV)  vaccine. Males aged 56-21 years who have not received the vaccine previously should receive the 3-dose series. Males aged 22-26 years may be immunized. Immunization is recommended through the age of 21 years for any male who has sex with males and did not get any or all doses earlier. Immunization is recommended for any person with an immunocompromised condition through the age of 78 years if he did not get any or all doses earlier. During the 3-dose series, the second dose should be obtained 4-8 weeks after the first dose. The third dose should be obtained 24 weeks after the first dose and 16 weeks after the second dose.  Zoster vaccine. One dose is recommended for adults aged 73 years or older unless certain conditions are present.    PREVNAR  - Pneumococcal 13-valent conjugate (PCV13) vaccine. When  indicated, a person who is uncertain of his immunization history and has no record of immunization should receive the PCV13 vaccine. An adult aged 74 years or older who has certain medical conditions and has not been previously immunized should receive 1 dose of PCV13 vaccine. This PCV13 should be followed with a dose of pneumococcal polysaccharide (PPSV23) vaccine. The PPSV23 vaccine dose should be obtained at least 8 weeks after the dose of PCV13 vaccine. An adult aged 90 years or older who has certain medical conditions and previously received 1 or more doses of PPSV23 vaccine should receive 1 dose of PCV13. The PCV13 vaccine dose should be obtained 1 or more years after the last PPSV23 vaccine dose.    PNEUMOVAX - Pneumococcal polysaccharide (PPSV23) vaccine. When PCV13 is also indicated, PCV13 should be obtained first. All adults aged 60 years and older should be immunized. An adult younger than age 88 years who has certain medical conditions should be immunized. Any person who resides in a nursing home or long-term care facility should be immunized. An adult smoker should be immunized. People with an immunocompromised condition and certain other conditions should receive both PCV13 and PPSV23 vaccines. People with human immunodeficiency virus (HIV) infection should be immunized as soon as possible after diagnosis. Immunization during chemotherapy or radiation therapy should be avoided. Routine use of PPSV23 vaccine is not recommended for American Indians, [place] Natives, or people younger than 65 years unless there are medical conditions that require PPSV23 vaccine. When indicated, people who have unknown immunization and have no record of immunization should receive PPSV23 vaccine. One-time revaccination 5 years after the first dose of PPSV23 is recommended for people aged 19-64 years who have chronic kidney failure, nephrotic syndrome, asplenia, or immunocompromised conditions. People who received 1-2  doses of PPSV23 before age 44 years should receive another dose of PPSV23 vaccine at age 68 years or later if at least 5 years have passed since the previous dose. Doses of PPSV23 are not needed for people immunized with PPSV23 at or after age 5 years.    Hepatitis A vaccine. Adults who wish to be protected from this disease, have certain high-risk conditions, work with hepatitis A-infected animals, work in hepatitis A research labs, or travel to or work in countries with a high rate of hepatitis A should be immunized. Adults who were previously unvaccinated and who anticipate close contact with an international adoptee during the first 60 days after arrival in the [place] from a country with a high rate of hepatitis A should be immunized.    Hepatitis B vaccine. Adults should be immunized if they wish to be protected from this disease,  have certain high-risk conditions, may be exposed to blood or other infectious body fluids, are household contacts or sex partners of hepatitis B positive people, are clients or workers in certain care facilities, or travel to or work in countries with a high rate of hepatitis B.   Preventive Service / Frequency   Ages 19 to 16  Blood pressure check.  Lipid and cholesterol check  Lung cancer screening. / Every year if you are aged 30-80 years and have a 30-pack-year history of smoking and currently smoke or have quit within the past 15 years. Yearly screening is stopped once you have quit smoking for at least 15 years or develop a health problem that would prevent you from having lung cancer treatment.  Fecal occult blood test (FOBT) of stool. / Every year beginning at age 50 and continuing until age 73. You may not have to do this test if you get a colonoscopy every 10 years.  Flexible sigmoidoscopy** or colonoscopy.** / Every 5 years for a flexible sigmoidoscopy or every 10 years for a colonoscopy beginning at age 49 and continuing until age  74. Screening for abdominal aortic aneurysm (AAA)  by ultrasound is recommended for people who have history of high blood pressure or who are current or former smokers. +++++++++++ Recommend Adult Low Dose Aspirin or  coated  Aspirin 81 mg daily  To reduce risk of Colon Cancer 20 %,  Skin Cancer 26 % ,  Melanoma 46%  and  Pancreatic cancer 60% ++++++++++++++++++++ Vitamin D goal  is between 70-100.  Please make sure that you are taking your Vitamin D as directed.  It is very important as a natural anti-inflammatory  helping hair, skin, and nails, as well as reducing stroke and heart attack risk.  It helps your bones and helps with mood. It also decreases numerous cancer risks so please take it as directed.  Low Vit D is associated with a 200-300% higher risk for CANCER  and 200-300% higher risk for HEART   ATTACK  &  STROKE.   .....................................Marland Kitchen It is also associated with higher death rate at younger ages,  autoimmune diseases like Rheumatoid arthritis, Lupus, Multiple Sclerosis.    Also many other serious conditions, like depression, Alzheimer's Dementia, infertility, muscle aches, fatigue, fibromyalgia - just to name a few. +++++++++++++++++++++ Recommend the book "The END of DIETING" by Dr Excell Seltzer  & the book "The END of DIABETES " by Dr Excell Seltzer At [email or web address] - get book & Audio CD's    Being diabetic has a  300% increased risk for heart attack, stroke, cancer, and alzheimer- type vascular dementia. It is very important that you work harder with diet by avoiding all foods that are white. Avoid white rice (brown & wild rice is OK), white potatoes (sweetpotatoes in moderation is OK), White bread or wheat bread or anything made out of white flour like bagels, donuts, rolls, buns, biscuits, cakes, pastries, cookies, pizza crust, and pasta (made from white flour & egg whites) - vegetarian pasta or spinach or wheat pasta is OK. Multigrain breads like Arnold's or  [employer], or multigrain sandwich thins or flatbreads.  Diet, exercise and weight loss can reverse and cure diabetes in the early stages.  Diet, exercise and weight loss is very important in the control and prevention of complications of diabetes which affects every system in your body, ie. Brain - dementia/stroke, eyes - glaucoma/blindness, heart - heart attack/heart failure, kidneys - dialysis, stomach - gastric paralysis, intestines -  malabsorption, nerves - severe painful neuritis, circulation - gangrene & loss of a leg(s), and finally cancer and Alzheimers.    I recommend avoid fried & greasy foods,  sweets/candy, white rice (brown or wild rice or Quinoa is OK), white potatoes (sweet potatoes are OK) - anything made from white flour - bagels, doughnuts, rolls, buns, biscuits,white and wheat breads, pizza crust and traditional pasta made of white flour & egg white(vegetarian pasta or spinach or wheat pasta is OK).  Multi-grain bread is OK - like multi-grain flat bread or sandwich thins. Avoid alcohol in excess. Exercise is also important.    Eat all the vegetables you want - avoid meat, especially red meat and dairy - especially cheese.  Cheese is the most concentrated form of trans-fats which is the worst thing to clog up our arteries. Veggie cheese is OK which can be found in the fresh produce section at [employer] or [employer] or [employer]  ++++++++++++++++++++++ DASH Eating Plan  DASH stands for "Dietary Approaches to Stop Hypertension."   The DASH eating plan is a healthy eating plan that has been shown to reduce high blood pressure (hypertension). Additional health benefits may include reducing the risk of type 2 diabetes mellitus, heart disease, and stroke. The DASH eating plan may also help with weight loss. WHAT DO I NEED TO KNOW ABOUT THE DASH EATING PLAN? For the DASH eating plan, you will follow these general guidelines:  Choose foods with a percent daily value for sodium of  less than 5% (as listed on the food label).  Use salt-free seasonings or herbs instead of table salt or sea salt.  Check with your health care provider or pharmacist before using salt substitutes.  Eat lower-sodium products, often labeled as "lower sodium" or "no salt added."  Eat fresh foods.  Eat more vegetables, fruits, and low-fat dairy products.  Choose whole grains. Look for the word "whole" as the first word in the ingredient list.  Choose fish   Limit sweets, desserts, sugars, and sugary drinks.  Choose heart-healthy fats.  Eat veggie cheese   Eat more home-cooked food and less restaurant, buffet, and fast food.  Limit fried foods.  Cook foods using methods other than frying.  Limit canned vegetables. If you do use them, rinse them well to decrease the sodium.  When eating at a restaurant, ask that your food be prepared with less salt, or no salt if possible.                      WHAT FOODS CAN I EAT? Read Dr Fara Olden Fuhrman's books on The End of Dieting & The End of Diabetes  Grains Whole grain or whole wheat bread. Brown rice. Whole grain or whole wheat pasta. Quinoa, bulgur, and whole grain cereals. Low-sodium cereals. Corn or whole wheat flour tortillas. Whole grain cornbread. Whole grain crackers. Low-sodium crackers.  Vegetables Fresh or frozen vegetables (raw, steamed, roasted, or grilled). Low-sodium or reduced-sodium tomato and vegetable juices. Low-sodium or reduced-sodium tomato sauce and paste. Low-sodium or reduced-sodium canned vegetables.   Fruits All fresh, canned (in natural juice), or frozen fruits.  Protein Products  All fish and seafood.  Dried beans, peas, or lentils. Unsalted nuts and seeds. Unsalted canned beans.  Dairy Low-fat dairy products, such as skim or 1% milk, 2% or reduced-fat cheeses, low-fat ricotta or cottage cheese, or plain low-fat yogurt. Low-sodium or reduced-sodium cheeses.  Fats and Oils Tub margarines without trans  fats. Light or reduced-fat  mayonnaise and salad dressings (reduced sodium). Avocado. Safflower, olive, or canola oils. Natural peanut or almond butter.  Other Unsalted popcorn and pretzels. The items listed above may not be a complete list of recommended foods or beverages. Contact your dietitian for more options.  +++++++++++++++++++  WHAT FOODS ARE NOT RECOMMENDED? Grains/ White flour or wheat flour White bread. White pasta. White rice. Refined cornbread. Bagels and croissants. Crackers that contain trans fat.  Vegetables  Creamed or fried vegetables. Vegetables in a . Regular canned vegetables. Regular canned tomato sauce and paste. Regular tomato and vegetable juices.  Fruits Dried fruits. Canned fruit in light or heavy syrup. Fruit juice.  Meat and Other Protein Products Meat in general - RED meat & White meat.  Fatty cuts of meat. Ribs, chicken wings, all processed meats as bacon, sausage, bologna, salami, fatback, hot dogs, bratwurst and packaged luncheon meats.  Dairy Whole or 2% milk, cream, half-and-half, and cream cheese. Whole-fat or sweetened yogurt. Full-fat cheeses or blue cheese. Non-dairy creamers and whipped toppings. Processed cheese, cheese spreads, or cheese curds.  Condiments Onion and garlic salt, seasoned salt, table salt, and sea salt. Canned and packaged gravies. Worcestershire sauce. Tartar sauce. Barbecue sauce. Teriyaki sauce. Soy sauce, including reduced sodium. Steak sauce. Fish sauce. Oyster sauce. Cocktail sauce. Horseradish. Ketchup and mustard. Meat flavorings and tenderizers. Bouillon cubes. Hot sauce. Tabasco sauce. Marinades. Taco seasonings. Relishes.  Fats and Oils Butter, stick margarine, lard, shortening and bacon fat. Coconut, palm kernel, or palm oils. Regular salad dressings.  Pickles and olives. Salted popcorn and pretzels.  The items listed above may not be a complete list of foods and beverages to avoid.

## 2016-04-26 NOTE — Progress Notes (Signed)
Ronald Winters   Ronald Winters, M.D.    Ronald Winters. Ronald Winters, P.A.-C      Starlyn Skeans, P.A.-C  [facility]                [address] [place], [place] [id number] Telephone [phone] Telefax [phone]      This very nice 45 y.o. MBM presents for  evaluation and management.  Patient is being screened  for HTN, Prediabetes, Hyperlipidemia and Vitamin D Deficiency. Patient had been liauid off from the [facility] [employer] and has been out of work about 3 years and has not been seen in that interval.      Patient has hx/o elevated BP 142/84 at recent ER visit in July when screened for Chest pain and released after negative cardiac w/u. Today's BP is 124/84. Patient denies any cardiac symptoms as chest pain, palpitations, shortness of breath, dizziness or ankle swelling.     Patient's hyperlipidemia is not controlled with diet. Patient denies myalgias or other medication SE's. Last lipids were  Lab Results  Component Value Date   CHOL 182 04/26/2016   HDL 41 04/26/2016   LDLCALC 116 04/26/2016   TRIG 125 04/26/2016   CHOLHDL 4.4 04/26/2016      Patient has hx/o several elevated glucoses recorded in the EMR and patient denies reactive hypoglycemic symptoms, visual blurring, diabetic polys or paresthesias. Patient's BMI is 37+ and he's being screened for PreDiabetes. Current  A1c is 5.7% and borderline in the preDiabetic range.      Finally, patient has history of Vitamin D Deficiency of "25" in 2009.  No Known Allergies  Health Maintenance  Topic Date Due  . HIV Screening  09/14/1983  . TETANUS/TDAP  09/14/1987  . INFLUENZA VACCINE  02/16/2016   Social History   Social History  . Marital status: Married    Spouse name: N/A  . Number of children: N/A  . Years of education: N/A   Occupational History  . Unemployed   Social History Main Topics  . Smoking status: Divorced  . Smokeless tobacco: Not on  file  . Alcohol use No  . Drug use: No  . Sexual activity: Active    ROS Constitutional: Denies fever, chills, weight loss/gain, headaches, insomnia,  night sweats or change in appetite. Does c/o fatigue. Eyes: Denies redness, blurred vision, diplopia, discharge, itchy or watery eyes.  ENT: Denies discharge, congestion, post nasal drip, epistaxis, sore throat, earache, hearing loss, dental pain, Tinnitus, Vertigo, Sinus pain or snoring.  Cardio: Denies chest pain, palpitations, irregular heartbeat, syncope, dyspnea, diaphoresis, orthopnea, PND, claudication or edema Respiratory: denies cough, dyspnea, DOE, pleurisy, hoarseness, laryngitis or wheezing.  Gastrointestinal: Denies dysphagia, heartburn, reflux, water brash, pain, cramps, nausea, vomiting, bloating, diarrhea, constipation, hematemesis, melena, hematochezia, jaundice or hemorrhoids Genitourinary: Denies dysuria, frequency, urgency, nocturia, hesitancy, discharge, hematuria or flank pain Musculoskeletal: Denies arthralgia, myalgia, stiffness, Jt. Swelling, pain, limp or strain/sprain. Denies Falls. Skin: Denies puritis, rash, hives, warts, acne, eczema or change in skin lesion Neuro: No weakness, tremor, incoordination, spasms, paresthesia or pain Psychiatric: Denies confusion, memory loss or sensory loss. Denies Depression. Endocrine: Denies change in weight, skin, hair change, nocturia, and paresthesia, diabetic polys, visual blurring or hyper / hypo glycemic episodes.  Heme/Lymph: No excessive bleeding, bruising or enlarged lymph nodes.  Physical Exam  BP 124/84   Pulse 80   Temp 97.3  F (36.3 C)   Resp 16   Ht 6\' 3"  (1.905 m)   Wt 286 lb 3.2 oz (129.8 kg)   BMI 35.77 kg/m   General Appearance: Over nourished, in no apparent distress.  Eyes: PERRLA, EOMs, conjunctiva no swelling or erythema, normal fundi and vessels. Sinuses: No frontal/maxillary tenderness ENT/Mouth: EACs patent / TMs  nl. Nares clear without  erythema, swelling, mucoid exudates. Oral hygiene is good. No erythema, swelling, or exudate. Tongue normal, non-obstructing. Tonsils not swollen or erythematous. Hearing normal.  Neck: Supple, thyroid normal. No bruits, nodes or JVD. Respiratory: Respiratory effort normal.  BS equal and clear bilateral without rales, rhonci, wheezing or stridor. Cardio: Heart sounds are normal with regular rate and rhythm and no murmurs, rubs or gallops. Peripheral pulses are normal and equal bilaterally without edema. No aortic or femoral bruits. Chest: symmetric with normal excursions and percussion.  Abdomen: Soft, with Nl bowel sounds. Nontender, no guarding, rebound, hernias, masses, or organomegaly.  Lymphatics: Non tender without lymphadenopathy.  Musculoskeletal: Full ROM all peripheral extremities, joint stability, 5/5 strength, and normal gait. Skin: Warm and dry without rashes, lesions, cyanosis, clubbing or  ecchymosis.  Neuro: Cranial nerves intact, reflexes equal bilaterally. Normal muscle tone, no cerebellar symptoms. Sensation intact.  Pysch: Alert and oriented X 3 with normal affect, insight and judgment appropriate.   Assessment and Plan  1. Elevated BP without diagnosis of hypertension   2. Other abnormal glucose  - Insulin, random - Hemoglobin A1c  3. Abnormal serum cholesterol  - Lipid panel     Continue prudent diet as discussed, weight control, BP monitoring, regular exercise, and medications as discussed.  Discussed med effects and SE's. Routine screening labs and tests as requested with regular follow-up as recommended. Over 40 minutes of exam, counseling, chart review and high complex critical decision making was performed

## 2016-04-27 LAB — INSULIN, RANDOM: Insulin: 10.2 u[IU]/mL (ref 2.0–19.6)

## 2016-04-27 LAB — HEMOGLOBIN A1C
Hgb A1c MFr Bld: 5.7 % — ABNORMAL HIGH (ref <5.7)
Mean Plasma Glucose: 117 mg/dL

## 2016-06-06 IMAGING — CR DG CHEST 2V
2 series · 2 of 2 positions shown · non-contrast
Comparison: None.

CLINICAL DATA: 44-year-old male with left chest pain intermittently
for the past 2 months.

EXAM:
CHEST  2 VIEW

[chest pa]
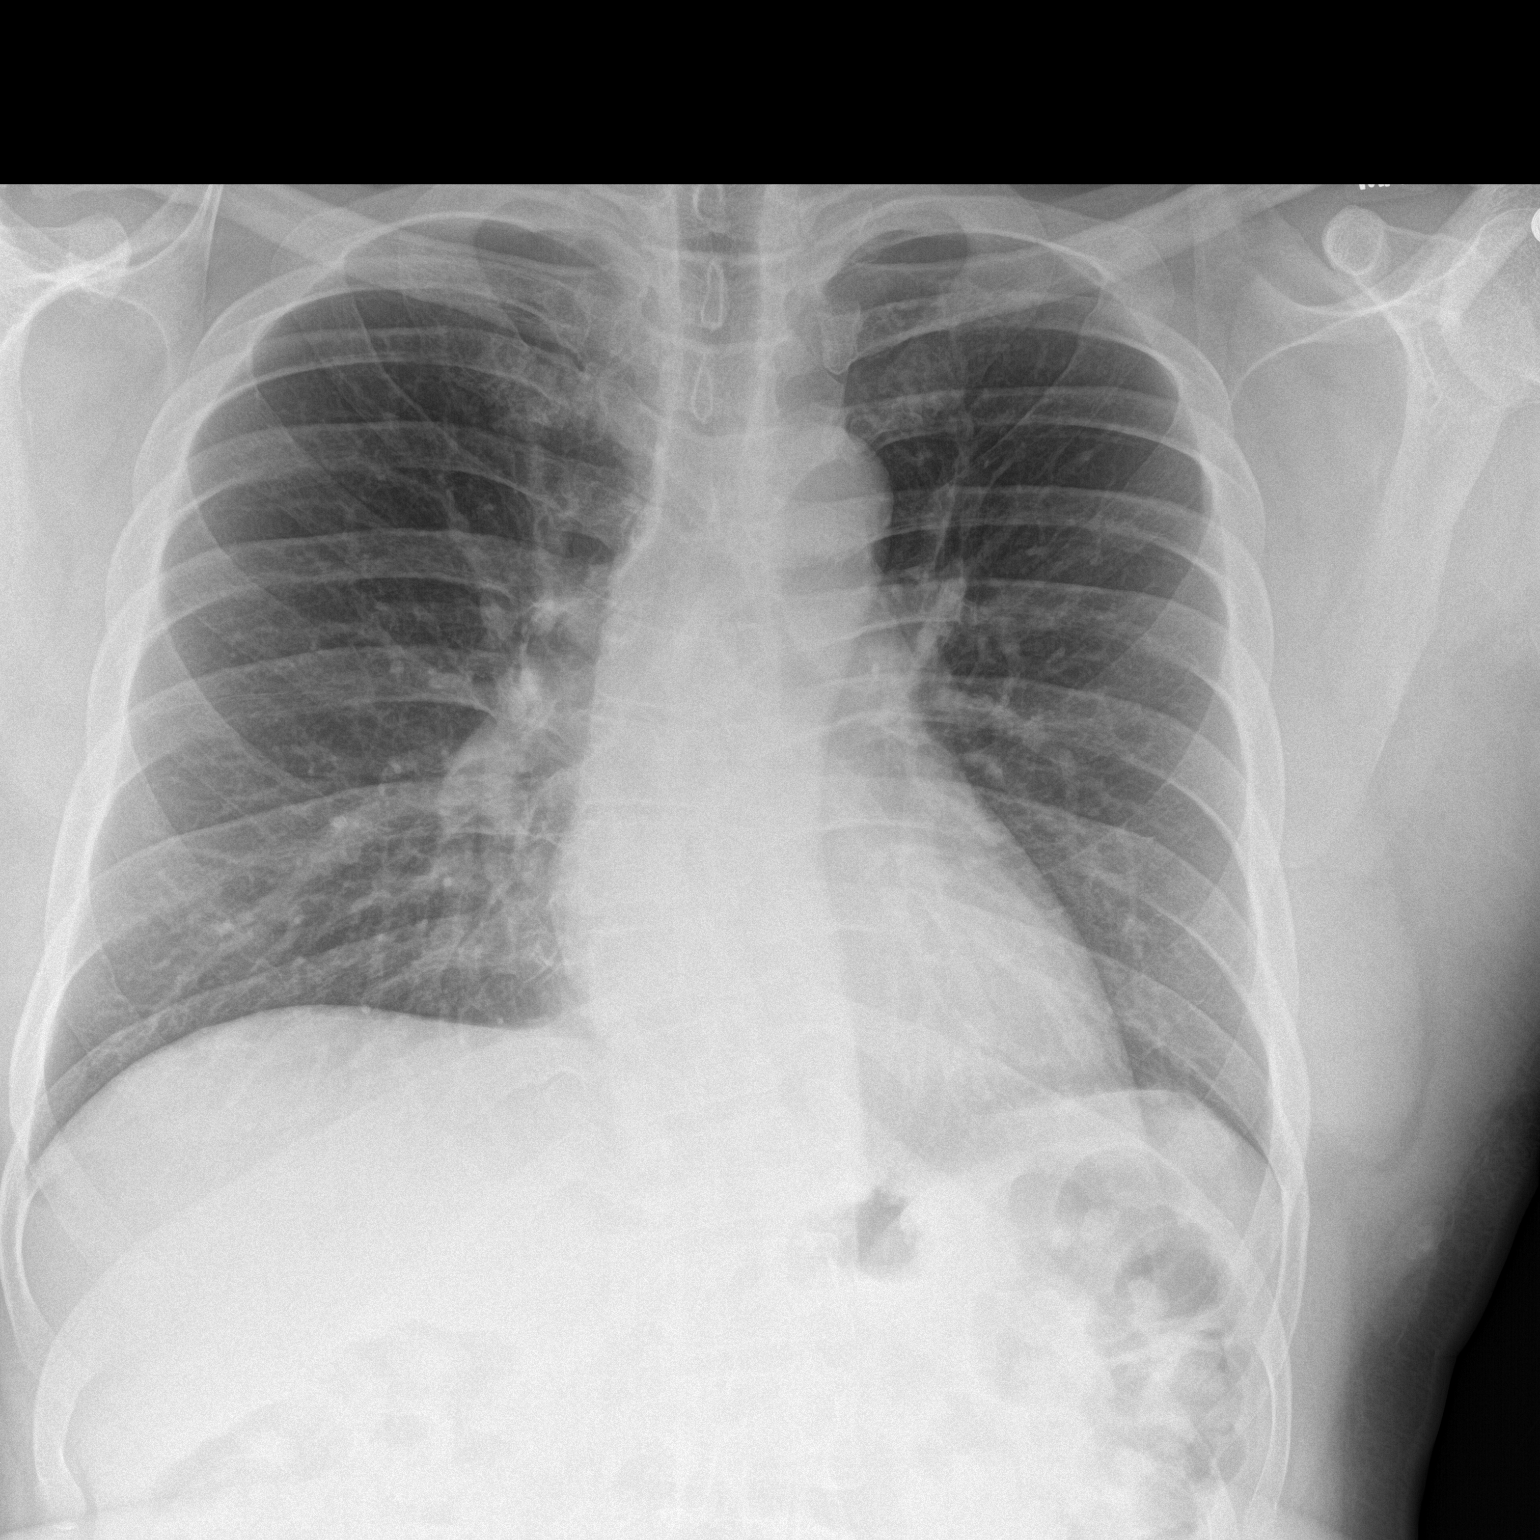

[chest lat]
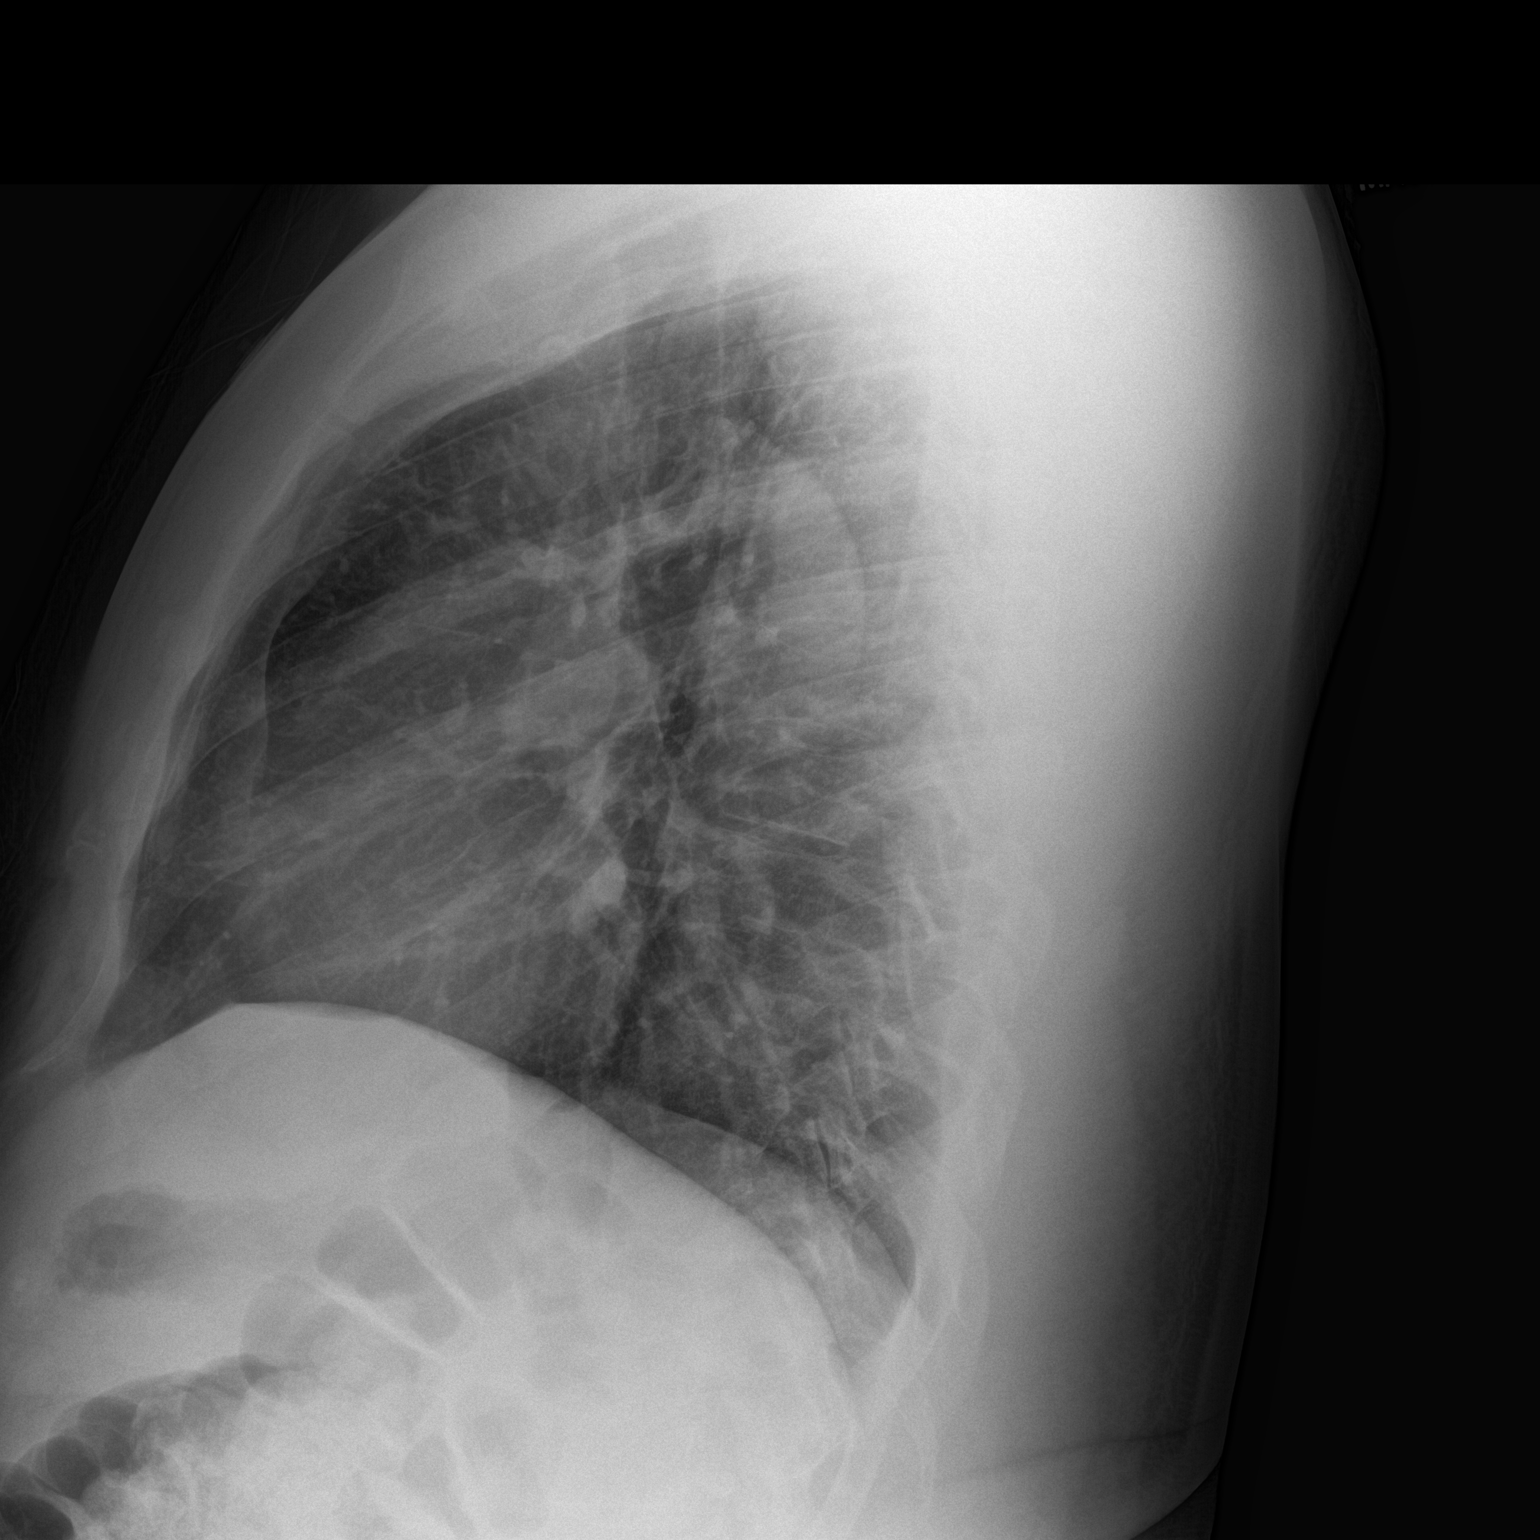

[2 of 2 positions shown; findings below may reference images not displayed]

FINDINGS: The lungs are clear and negative for focal airspace consolidation,
pulmonary edema or suspicious pulmonary nodule. No pleural effusion
or pneumothorax. Cardiac and mediastinal contours are within normal
limits. No acute fracture or lytic or blastic osseous lesions.
Dextro convex scoliosis of the thoracic spine centered at T8-T9. The
visualized upper abdominal bowel gas pattern is unremarkable.
IMPRESSION: Negative chest x-ray.

## 2016-06-20 IMAGING — CR DG CHEST 2V
2 series · 2 of 2 positions shown · non-contrast
Comparison: 01/31/2015

CLINICAL DATA: Chest pressure on the left for 1 hr. Initial
encounter

EXAM:
CHEST  2 VIEW

[chest pa]
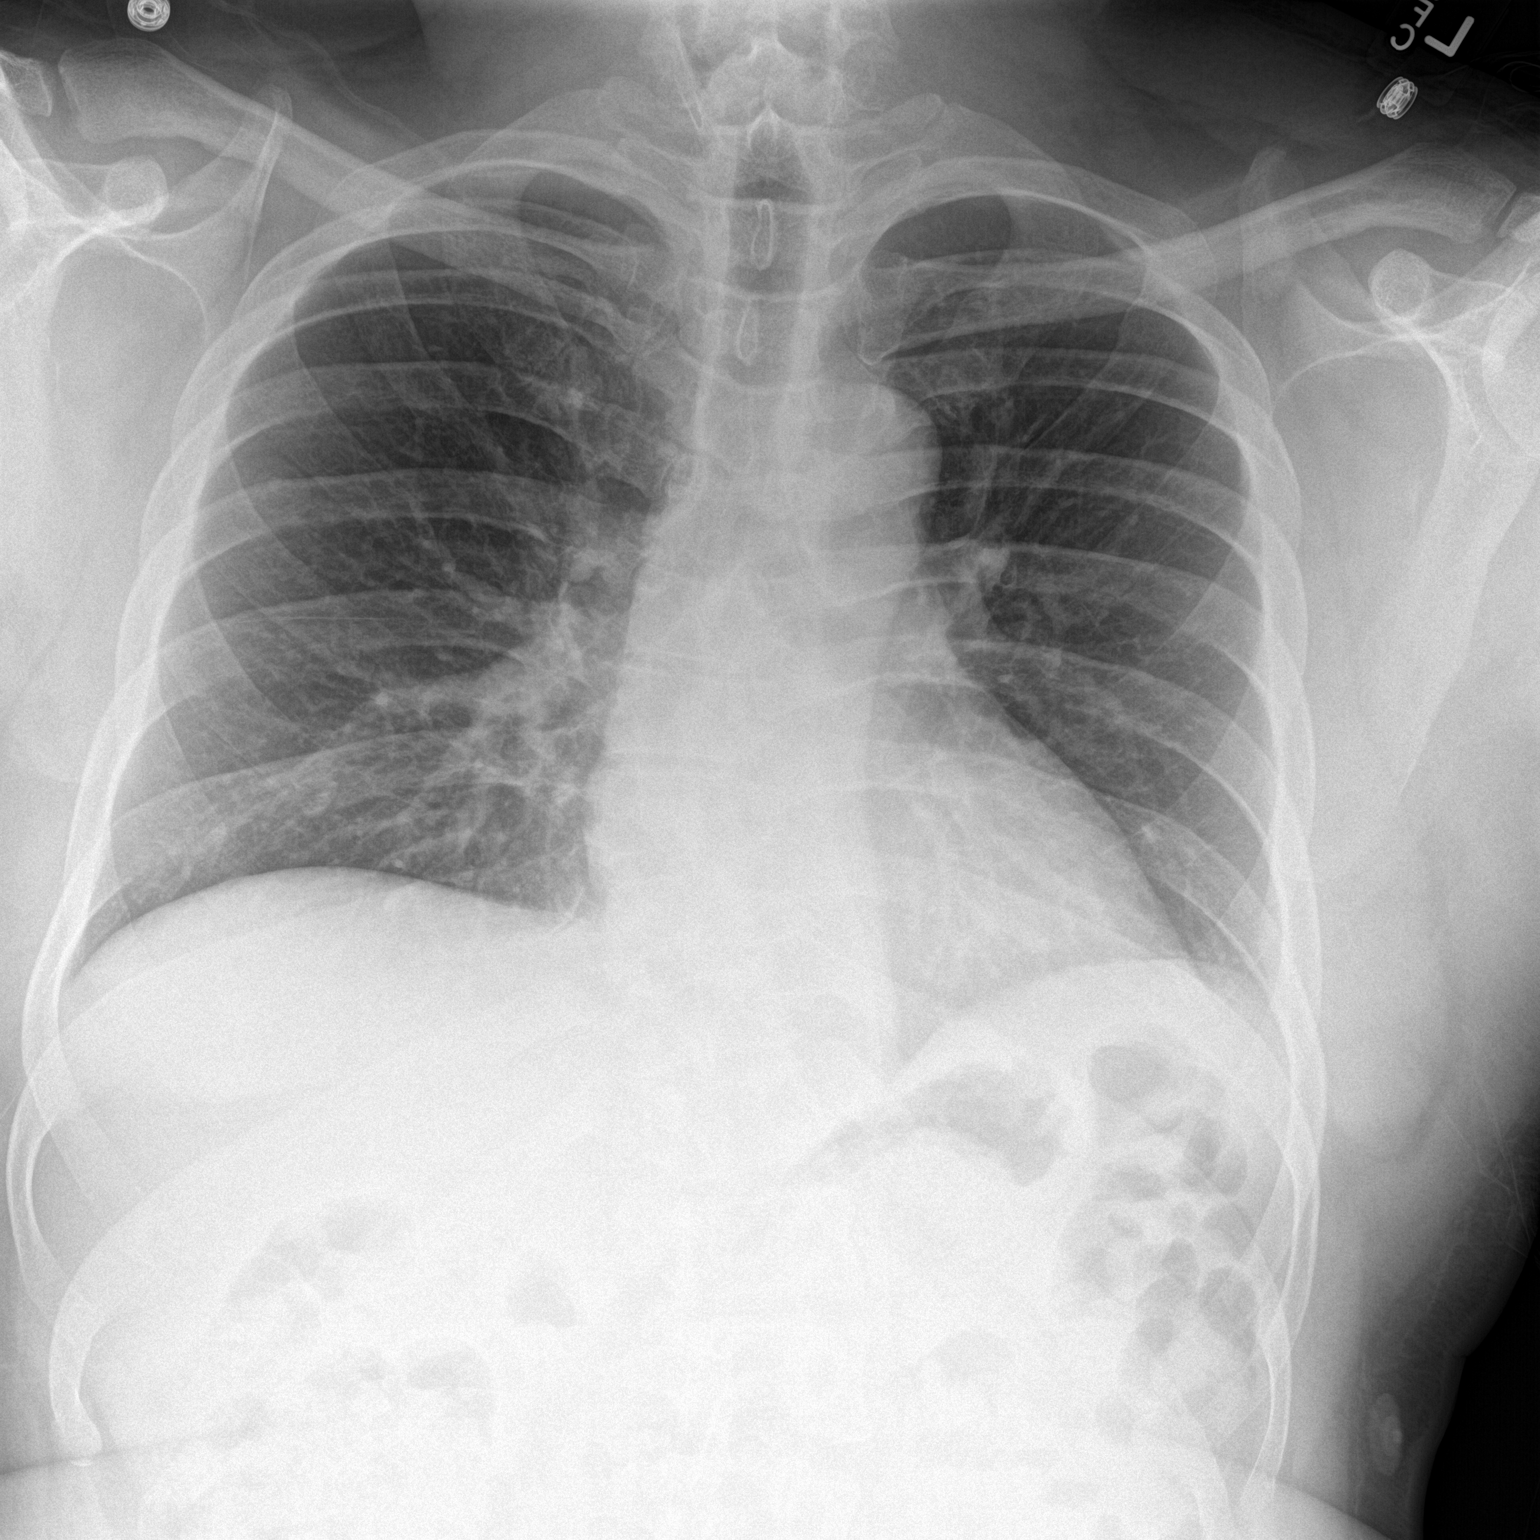

[chest lat]
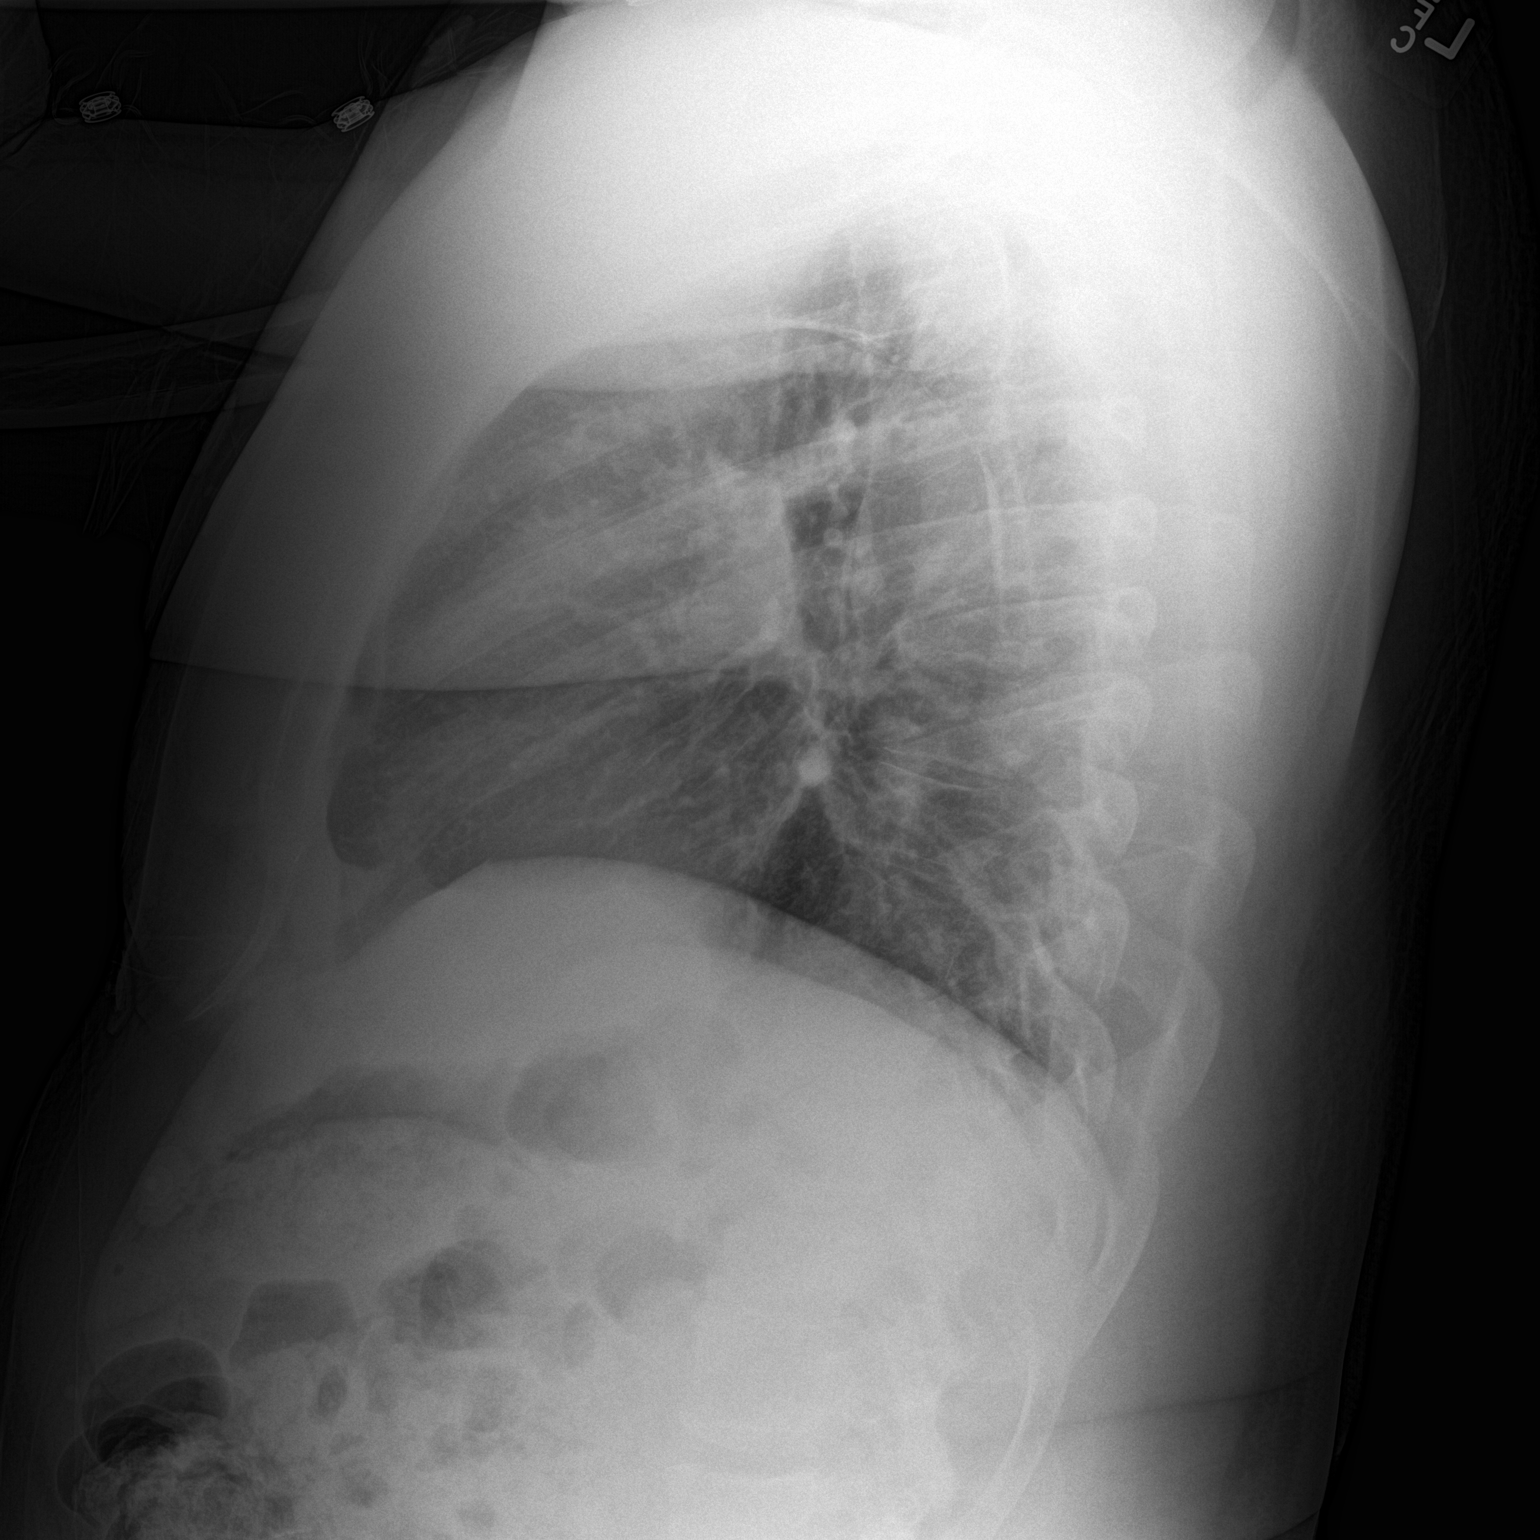

[2 of 2 positions shown; findings below may reference images not displayed]

FINDINGS: The heart size and mediastinal contours are within normal limits.
Both lungs are clear. The visualized skeletal structures are
unremarkable.
IMPRESSION: No active cardiopulmonary disease.

## 2016-07-27 ENCOUNTER — Encounter: Payer: Self-pay | Admitting: Internal Medicine

## 2017-05-23 NOTE — Progress Notes (Signed)
[facility]   Unk Pinto, M.D.     Uvaldo Bristle. Silverio Lay, P.A.-C [address], [address]                [place], [place] [address] Telephone [phone] Telefax [phone] Annual  Screening/Preventative Visit  & Comprehensive Evaluation & Examination     This very nice 46 y.o. DBM presents for a  Check-up.  Patient has been followed expectantly for labile HTN, Prediabetes, Hyperlipidemia and Vitamin D Deficiency. Patient has just started working at [employer] and insurance has not started yet. He has concerns re: a Lump of his posterior scalp that bleeds occasionally. He relates the lump has been present for years and has gradually been increasing in size.     Patient has been monitored expectantly for labile HTN. Patient's BP has been controlled at home.  Today's BP is at goal 114/80. Patient denies any cardiac symptoms as chest pain, palpitations, shortness of breath, dizziness or ankle swelling.     Patient's hyperlipidemia is not controlled with diet. Last lipids were not at goal: Lab Results  Component Value Date   CHOL 182 04/26/2016   HDL 41 04/26/2016   LDLCALC 116 04/26/2016   TRIG 125 04/26/2016   CHOLHDL 4.4 04/26/2016      Patient has Morbid Obesity (BMI 37+) and prediabetes (A1c 5.7% in Oct 2017).  Patient denies reactive hypoglycemic symptoms, visual blurring, diabetic polys or paresthesias.        Finally, patient has history of Vitamin D Deficiency ("25" in 2009).  No Known Allergies.  Neg PMHx.  Health Maintenance  Topic Date Due  . TETANUS/TDAP  11/02/2007  . Pneumovax  01/14/2009   No past surgical history on file.   Social History   Socioeconomic History  . 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 Smoking status: Never Smoker  Substance and Sexual Activity  . Alcohol use: No  . Drug use: No     ROS Constitutional: Denies fever, chills, weight loss/gain, headaches, insomnia,  night sweats or change in appetite. Does c/o fatigue. Eyes: Denies redness, blurred vision, diplopia, discharge, itchy or watery eyes.  ENT: Denies discharge, congestion, post nasal drip, epistaxis, sore throat, earache, hearing loss, dental pain, Tinnitus, Vertigo, Sinus pain or snoring.  Cardio: Denies chest pain, palpitations, irregular heartbeat, syncope, dyspnea, diaphoresis, orthopnea, PND, claudication or edema Respiratory: denies cough, dyspnea, DOE, pleurisy, hoarseness, laryngitis or wheezing.  Gastrointestinal: Denies dysphagia, heartburn, reflux, water brash, pain, cramps, nausea, vomiting, bloating, diarrhea, constipation, hematemesis, melena, hematochezia, jaundice or hemorrhoids Genitourinary: Denies dysuria, frequency, urgency, nocturia, hesitancy, discharge, hematuria or flank pain Musculoskeletal: Denies arthralgia, myalgia, stiffness, Jt. Swelling, pain, limp or strain/sprain. Denies Falls. Skin: Posterior scalp lesion as above  Neuro: No weakness, tremor, incoordination, spasms, paresthesia or pain Psychiatric: Denies confusion, memory loss or sensory loss. Denies Depression. Endocrine: Denies change in weight, skin, hair change, nocturia, and paresthesia, diabetic polys, visual blurring or hyper / hypo glycemic episodes.  Heme/Lymph: No excessive bleeding, bruising or enlarged lymph nodes.  Physical Exam  BP 114/80   Pulse 80   Temp (!) 97.3 F (36.3 C)   Resp 18   Ht 6\' 3"  (1.905 m)   Wt 272 lb 3.2 oz (123.5 kg)   BMI 34.02 kg/m   General Appearance: Well nourished and  well groomed and in no apparent distress.  Eyes: PERRLA, EOMs, conjunctiva no swelling or erythema, normal fundi and vessels. Sinuses: No frontal/maxillary tenderness ENT/Mouth: EACs patent / TMs  nl. Nares clear without erythema, swelling, mucoid exudates. Oral hygiene is good. No erythema, swelling, or exudate.  Tongue normal, non-obstructing. Tonsils not swollen or erythematous. Hearing normal.  Neck: Supple, thyroid normal. No bruits, nodes or JVD. Respiratory: Respiratory effort normal.  BS equal and clear bilateral without rales, rhonci, wheezing or stridor. Cardio: Heart sounds are normal with regular rate and rhythm and no murmurs, rubs or gallops. Peripheral pulses are normal and equal bilaterally without edema. No aortic or femoral bruits. Chest: symmetric with normal excursions and percussion.  Abdomen: Soft, with Nl bowel sounds. Nontender, no guarding, rebound, hernias, masses, or organomegaly.  Lymphatics: Non tender without lymphadenopathy.  Genitourinary: No hernias.Testes nl. DRE - prostate nl for age - smooth & firm w/o nodules. Musculoskeletal: Full ROM all peripheral extremities, joint stability, 5/5 strength, and normal gait. Skin: Hypertrophic firm pink raised irregular lesion ~ 1.5 x 2.5 " over post scalp.  Neuro: Cranial nerves intact, reflexes equal bilaterally. Normal muscle tone, no cerebellar symptoms. Sensation intact.  Pysch: Alert and oriented X 3 with normal affect, insight and judgment appropriate.   Assessment and Plan  1. Labile hypertension  2. Prediabetes  3. Vitamin D deficiency  4. Neoplasm of uncertain behavior of scalp  - Dermatology referral for Bx to r/o Skin cancer, Lymphoma, amelanotic melanoma, etc  - CBC / Diff       Patient was counseled in prudent diet, weight control to achieve/maintain BMI less than 25, BP monitoring, regular exercise and medications as discussed.    Over 30 minutes of exam, counseling, chart review and high complex critical decision making was performed  ( No charge OV)

## 2017-05-24 ENCOUNTER — Ambulatory Visit (INDEPENDENT_AMBULATORY_CARE_PROVIDER_SITE_OTHER): Payer: Self-pay | Admitting: Internal Medicine

## 2017-05-24 ENCOUNTER — Encounter: Payer: Self-pay | Admitting: Internal Medicine

## 2017-05-24 VITALS — BP 114/80 | HR 80 | Temp 97.3°F | Resp 18 | Ht 75.0 in | Wt 272.2 lb

## 2017-05-24 DIAGNOSIS — R7303 Prediabetes: Secondary | ICD-10-CM

## 2017-05-24 DIAGNOSIS — D485 Neoplasm of uncertain behavior of skin: Secondary | ICD-10-CM

## 2017-05-24 DIAGNOSIS — E559 Vitamin D deficiency, unspecified: Secondary | ICD-10-CM

## 2017-05-24 DIAGNOSIS — I10 Essential (primary) hypertension: Secondary | ICD-10-CM

## 2017-05-24 DIAGNOSIS — R0989 Other specified symptoms and signs involving the circulatory and respiratory systems: Secondary | ICD-10-CM

## 2017-05-24 LAB — CBC WITH DIFFERENTIAL/PLATELET
Basophils Absolute: 50 cells/uL (ref 0–200)
Basophils Relative: 1 %
Eosinophils Absolute: 240 cells/uL (ref 15–500)
Eosinophils Relative: 4.8 %
HCT: 46.7 % (ref 38.5–50.0)
HEMOGLOBIN: 15.3 g/dL (ref 13.2–17.1)
LYMPHS ABS: 1425 {cells}/uL (ref 850–3900)
MCH: 25.6 pg — ABNORMAL LOW (ref 27.0–33.0)
MCHC: 32.8 g/dL (ref 32.0–36.0)
MCV: 78.2 fL — ABNORMAL LOW (ref 80.0–100.0)
MPV: 10.4 fL (ref 7.5–12.5)
Monocytes Relative: 9 %
Neutro Abs: 2835 cells/uL (ref 1500–7800)
Neutrophils Relative %: 56.7 %
Platelets: 252 10*3/uL (ref 140–400)
RBC: 5.97 10*6/uL — ABNORMAL HIGH (ref 4.20–5.80)
RDW: 13 % (ref 11.0–15.0)
Total Lymphocyte: 28.5 %
WBC mixed population: 450 cells/uL (ref 200–950)
WBC: 5 10*3/uL (ref 3.8–10.8)

## 2017-05-24 NOTE — Addendum Note (Signed)
Addended by: Unk Pinto on: 05/24/2017 01:15 PM   Modules accepted: Orders

## 2017-08-15 ENCOUNTER — Ambulatory Visit: Payer: Self-pay | Admitting: Adult Health

## 2017-08-15 ENCOUNTER — Encounter: Payer: Self-pay | Admitting: Adult Health

## 2017-08-15 VITALS — BP 122/80 | HR 70 | Temp 97.5°F | Ht 75.0 in | Wt 273.0 lb

## 2017-08-15 DIAGNOSIS — G510 Bell's palsy: Secondary | ICD-10-CM

## 2017-08-15 DIAGNOSIS — G4700 Insomnia, unspecified: Secondary | ICD-10-CM

## 2017-08-15 DIAGNOSIS — G5790 Unspecified mononeuropathy of unspecified lower limb: Secondary | ICD-10-CM

## 2017-08-15 DIAGNOSIS — M792 Neuralgia and neuritis, unspecified: Secondary | ICD-10-CM

## 2017-08-15 LAB — CBC WITH DIFFERENTIAL/PLATELET
Basophils Absolute: 71 {cells}/uL (ref 0–200)
Basophils Relative: 1.5 %
Eosinophils Absolute: 221 {cells}/uL (ref 15–500)
Eosinophils Relative: 4.7 %
HCT: 47.5 % (ref 38.5–50.0)
Hemoglobin: 15.5 g/dL (ref 13.2–17.1)
Lymphs Abs: 1377 {cells}/uL (ref 850–3900)
MCH: 25.5 pg — ABNORMAL LOW (ref 27.0–33.0)
MCHC: 32.6 g/dL (ref 32.0–36.0)
MCV: 78.1 fL — ABNORMAL LOW (ref 80.0–100.0)
MPV: 10.5 fL (ref 7.5–12.5)
Monocytes Relative: 8.5 %
Neutro Abs: 2632 {cells}/uL (ref 1500–7800)
Neutrophils Relative %: 56 %
Platelets: 228 10*3/uL (ref 140–400)
RBC: 6.08 Million/uL — ABNORMAL HIGH (ref 4.20–5.80)
RDW: 13.2 % (ref 11.0–15.0)
Total Lymphocyte: 29.3 %
WBC mixed population: 400 {cells}/uL (ref 200–950)
WBC: 4.7 10*3/uL (ref 3.8–10.8)

## 2017-08-15 LAB — BASIC METABOLIC PANEL WITHOUT GFR
BUN: 9 mg/dL (ref 7–25)
CO2: 27 mmol/L (ref 20–32)
Calcium: 9.8 mg/dL (ref 8.6–10.3)
Chloride: 103 mmol/L (ref 98–110)
Creat: 1.07 mg/dL (ref 0.60–1.35)
GFR, Est African American: 95 mL/min/{1.73_m2}
GFR, Est Non African American: 82 mL/min/{1.73_m2}
Glucose, Bld: 93 mg/dL (ref 65–99)
Potassium: 4.2 mmol/L (ref 3.5–5.3)
Sodium: 138 mmol/L (ref 135–146)

## 2017-08-15 LAB — SEDIMENTATION RATE: Sed Rate: 2 mm/h (ref 0–15)

## 2017-08-15 MED ORDER — PREDNISONE 20 MG PO TABS: ORAL_TABLET | ORAL | 0 refills | Status: AC

## 2017-08-15 MED ORDER — GABAPENTIN 300 MG PO CAPS: 300.0000 mg | ORAL_CAPSULE | Freq: Three times a day (TID) | ORAL | 1 refills | Status: DC

## 2017-08-15 NOTE — Patient Instructions (Signed)
Please monitor face symptoms - let us know of any new rash - let us know if you have any new concerning symptoms - headache, vision changes,   Gabapentin is for nerve pain as well as for sleep - take 1 tab with dinner, take second tab when you go to bed if still not sleepy. Prednisone should also help with any inflammation in your back that might be compression on a nerve to cause these symptoms.   Go to the ER if you have any new weakness in your legs, have trouble controlling your urine or bowels, or have worsening pain.   Will go to the ER if worsening headache, changes vision/speech, imbalance, weakness.    11 Tips to Follow:  1. No caffeine after 3pm: Avoid beverages with caffeine (soda, tea, energy drinks, etc.) especially after 3pm. 2. Don't go to bed hungry: Have your evening meal at least 3 hrs. before going to sleep. It's fine to have a small bedtime snack such as a glass of milk and a few crackers but don't have a big meal. 3. Have a nightly routine before bed: Plan on "winding down" before you go to sleep. Begin relaxing about 1 hour before you go to bed. Try doing a quiet activity such as listening to calming music, reading a book or meditating. 4. Turn off the TV and ALL electronics including video games, tablets, laptops, etc. 1 hour before sleep, and keep them out of the bedroom. 5. Turn off your cell phone and all notifications (new email and text alerts) or even better, leave your phone outside your room while you sleep. Studies have shown that a part of your brain continues to respond to certain lights and sounds even while you're still asleep. 6. Make your bedroom quiet, dark and cool. If you can't control the noise, try wearing earplugs or using a fan to block out other sounds. 7. Practice relaxation techniques. Try reading a book or meditating or drain your brain by writing a list of what you need to do the next day. 8. Don't nap unless you feel sick: you'll have a better  night's sleep. 9. Don't smoke, or quit if you do. Nicotine, alcohol, and marijuana can all keep you awake. Talk to your health care provider if you need help with substance use. 10. Most importantly, wake up at the same time every day (or within 1 hour of your usual wake up time) EVEN on the weekends. A regular wake up time promotes sleep hygiene and prevents sleep problems. 11. Reduce exposure to bright light in the last three hours of the day before going to sleep. Maintaining good sleep hygiene and having good sleep habits lower your risk of developing sleep problems. Getting better sleep can also improve your concentration and alertness. Try the simple steps in this guide. If you still have trouble getting enough rest, make an appointment with your health care provider.    Bell Palsy, Adult Bell palsy is a short-term inability to move muscles in part of the face. The inability to move (paralysis) results from inflammation or compression of the facial nerve, which travels along the skull and under the ear to the side of the face (7th cranial nerve). This nerve is responsible for facial movements that include blinking, closing the eyes, smiling, and frowning. What are the causes? The exact cause of this condition is not known. It may be caused by an infection from a virus, such as the chickenpox (herpes zoster), Epstein-Barr, or  mumps virus. What increases the risk? You are more likely to develop this condition if:  You are pregnant.  You have diabetes.  You have had a recent infection in your nose, throat, or airways (upper respiratory infection).  You have a weakened body defense system (immune system).  You have had a facial injury, such as a fracture.  You have a family history of Bell palsy.  What are the signs or symptoms? Symptoms of this condition include:  Weakness on one side of the face.  Drooping eyelid and corner of the mouth.  Excessive tearing in one  eye.  Difficulty closing the eyelid.  Dry eye.  Drooling.  Dry mouth.  Changes in taste.  Change in facial appearance.  Pain behind one ear.  Ringing in one or both ears.  Sensitivity to sound in one ear.  Facial twitching.  Headache.  Impaired speech.  Dizziness.  Difficulty eating or drinking.  Most of the time, only one side of the face is affected. Rarely, Bell palsy affects the whole face. How is this diagnosed? This condition is diagnosed based on:  Your symptoms.  Your medical history.  A physical exam.  You may also have to see health care providers who specialize in disorders of the nerves (neurologist) or diseases and conditions of the eye (ophthalmologist). You may have tests, such as:  A test to check for nerve damage (electromyogram).  Imaging studies, such as CT or MRI scans.  Blood tests.  How is this treated? This condition affects every person differently. Sometimes symptoms go away without treatment within a couple weeks. If treatment is needed, it varies from person to person. The goal of treatment is to reduce inflammation and protect the eye from damage. Treatment for Bell palsy may include:  Medicines, such as: ? Steroids to reduce swelling and inflammation. ? Antiviral drugs. ? Pain relievers, including aspirin, acetaminophen, or ibuprofen.  Eye drops or ointment to keep your eye moist.  Eye protection, if you cannot close your eye.  Exercises or massage to regain muscle strength and function (physical therapy).  Follow these instructions at home:  Take over-the-counter and prescription medicines only as told by your health care provider.  If your eye is affected: ? Keep your eye moist with eye drops or ointment as told by your health care provider. ? Follow instructions for eye care and protection as told by your health care provider.  Do any physical therapy exercises as told by your health care provider.  Keep all  follow-up visits as told by your health care provider. This is important. Contact a health care provider if:  You have a fever.  Your symptoms do not get better within 2-3 weeks, or your symptoms get worse.  Your eye is red, irritated, or painful.  You have new symptoms. Get help right away if:  You have weakness or numbness in a part of your body other than your face.  You have trouble swallowing.  You develop neck pain or stiffness.  You develop dizziness or shortness of breath. Summary  Bell palsy is a short-term inability to move muscles in part of the face. The inability to move (paralysis) results from inflammation or compression of the facial nerve.  This condition affects every person differently. Sometimes symptoms go away without treatment within a couple weeks.  If treatment is needed, it varies from person to person. The goal of treatment is to reduce inflammation and protect the eye from damage.  Contact your  health care provider if your symptoms do not get better within 2-3 weeks, or your symptoms get worse. This information is not intended to replace advice given to you by your health care provider. Make sure you discuss any questions you have with your health care provider. Document Released: 07/04/2005 Document Revised: 09/06/2016 Document Reviewed: 09/06/2016 Elsevier Interactive Patient Education  [employer].

## 2017-08-15 NOTE — Progress Notes (Signed)
Assessment and Plan:  Ronald Winters was seen today for facial droop, foot burn and insomnia.  Diagnoses and all orders for this visit:  Idiopathic acute facial nerve palsy Reported intermittent facial palsy without significant findings on neuro exam today -     CBC with Differential/Platelet -     Sedimentation rate -     BASIC METABOLIC PANEL WITH GFR -     predniSONE (DELTASONE) 20 MG tablet; 3 tablets daily with food for 3 days, 2 tabs daily for 3 days, 1 tab a day for 5 days. Patient is self pay, declines further testing at this time - instructed to observe for new rash or distinct drooping/weakness of face and notify office.    Foot neuralgia, unspecified laterality -     gabapentin (NEURONTIN) 300 MG capsule; Take 1 capsule (300 mg total) by mouth 3 (three) times daily. Declines lumbar imaging today - self pay Prednisone may also help with mild lower back pain- monitor for other concerning symptoms - weakness, loss of bowel or bladder control - notify office or present to ER  Insomnia, unspecified type Insomnia- good sleep hygiene discussed, increase day time activity, try melatonin or benadryl if this does not help we will call in sleep medication.  -     gabapentin (NEURONTIN) 300 MG capsule; Take 1 capsule (300 mg total) by mouth 3 (three) times daily.  Further disposition pending results of labs. Discussed med's effects and SE's.   Over 15 minutes of exam, counseling, chart review, and critical decision making was performed.   Future Appointments  Date Time Provider [place]  07/06/2018 10:00 AM Unk Pinto, MD [facility] None    ------------------------------------------------------------------------------------------------------------------   HPI BP 122/80   Pulse 70   Temp (!) 97.5 F (36.4 C)   Ht 6\' 3"  (1.905 m)   Wt 273 lb (123.8 kg)   SpO2 97%   BMI 34.12 kg/m   47 y.o.male presents for several new concerns.   He reports burning/needles  sensation in bilateral soles of his feet - 8/10 when bad - intermittent severity. He reports pain comes on suddenly during the day at variable times and lasts until he goes to sleep. He denies every waking up with the pain. He endorses bilateral - but worse in left. Intermittent lumbar pain - throbbing toothache- like pain at rest - he denies notable injury in the past, but has had back aches before - this feels different.   He also endorses an intermittent sense of "twitching" of his left side of his face, with a sensation of the left side of his face being "frozen." Denies association with exposure to cold, with chewing. Denies pain with this, HA, vision changes, other focal weakness, imbalance, dizziness, weakness of other extremities, confusion, changes in memory.   He reports he has also not been sleeping well over the last 3-4 months. He endorses difficulty falling and staying asleep- does not drink coffee frequently or consume other caffeine.   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 He does apparently have ongoing fatigue that has been worked up in the past.   No Known Allergies  No current outpatient medications on file prior to visit.   No current facility-administered medications on file prior to visit.     ROS: all negative except above.  Physical Exam:  BP 122/80   Pulse 70   Temp (!) 97.5 F (36.4 C)   Ht 6\' 3"  (1.905 m)   Wt 273 lb (123.8 kg)   SpO2 97%   BMI 34.12 kg/m   General Appearance: Well nourished, in no apparent distress. Eyes: PERRLA, EOMs, conjunctiva no swelling or erythema Sinuses: No Frontal/maxillary tenderness ENT/Mouth: Ext aud canals clear, TMs without erythema, bulging. No erythema, swelling, or exudate on post pharynx.  Tonsils not swollen or erythematous. Hearing normal.  Neck: Supple, thyroid  normal.  Respiratory: Respiratory effort normal, BS equal bilaterally without rales, rhonchi, wheezing or stridor.  Cardio: RRR with no MRGs. Brisk peripheral pulses without edema.  Abdomen: Soft, + BS.  Non tender, no guarding, rebound, hernias, masses. Lymphatics: Non tender without lymphadenopathy.  Musculoskeletal: Full ROM, 5/5 strength, normal gait.  Skin: Warm, dry without rashes, lesions, ecchymosis - bilateral 1st digit toe nails thickened - L side is missing front half, R side has layered thickening. No other toe nails without thickening.  Neuro: Cranial nerves intact. Normal muscle tone, no cerebellar symptoms. Sensation intact.  Psych: Awake and oriented X 3, withdrawn affect, Insight and Judgment appropriate.     Izora Ribas, NP 9:34 AM [facility]

## 2018-04-23 NOTE — Progress Notes (Signed)
Assessment and Plan:  Ronald Winters was seen today for form completion.  Diagnoses and all orders for this visit:  Routine medical exam No significant medication history Cleared for coach job pending PPD  Screening for tuberculosis -     Follow up in 48 hours for [place] for reading -     TB Skin Test  Need for tetanus booster -     Td vaccine greater than or equal to 49yo preservative free IM  Further disposition pending results of labs. Discussed med's effects and SE's.   Over 15 minutes of exam, counseling, chart review, and critical decision making was performed.   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 He does not have any significant medical problems.   He is overdue for tetanus shot, last recorded in 2017. He did receive typical childhood vaccinations.   He has - PPD in 2011 but will repeat this today.   History reviewed. No pertinent past medical history.   No Known Allergies  Current Outpatient Medications on File Prior to Visit  Medication Sig  . gabapentin (NEURONTIN) 300 MG capsule Take 1 capsule (300 mg total) by mouth 3 (three) times daily.   No current facility-administered medications on file prior to visit.     ROS: Review of Systems  Constitutional: Negative for malaise/fatigue and weight loss.  HENT: Negative for hearing loss and tinnitus.   Eyes: Negative for blurred vision and double vision.  Respiratory: Negative for cough, shortness of breath and wheezing.   Cardiovascular: Negative for chest pain,  palpitations, orthopnea, claudication and leg swelling.  Gastrointestinal: Negative for abdominal pain, blood in stool, constipation, diarrhea, heartburn, melena, nausea and vomiting.  Genitourinary: Negative.   Musculoskeletal: Negative for joint pain and myalgias.  Skin: Negative for rash.  Neurological: Negative for dizziness, tingling, sensory change, weakness and headaches.  Endo/Heme/Allergies: Negative for polydipsia.  Psychiatric/Behavioral: Negative.   All other systems reviewed and are negative.   Physical Exam:  BP 116/84   Pulse 100   Temp (!) 97.5 F (36.4 C)   Ht 6\' 3"  (1.905 m)   Wt 285 lb (129.3 kg)   SpO2 96%   BMI 35.62 kg/m   General Appearance: Well nourished, in no apparent distress. Eyes: PERRLA, EOMs, conjunctiva no swelling or erythema Sinuses: No Frontal/maxillary tenderness ENT/Mouth: Ext aud canals clear, TMs without erythema, bulging. No erythema, swelling, or exudate on post pharynx.  Tonsils not swollen or erythematous. Hearing normal.  Neck: Supple, thyroid normal.  Respiratory: Respiratory effort normal, BS equal bilaterally without rales, rhonchi, wheezing or stridor.  Cardio: RRR with no MRGs. Brisk peripheral pulses without edema.  Abdomen: Soft, + BS.  Non tender, no guarding, rebound, hernias, masses. Lymphatics: Non tender without lymphadenopathy.  Musculoskeletal: Full ROM, 5/5 strength, normal gait.  Skin: Warm, dry without rashes, lesions, ecchymosis.  Neuro: Cranial nerves intact. Normal muscle tone, no cerebellar symptoms. Sensation intact.  Psych: Awake and oriented X 3, normal affect, Insight and Judgment appropriate.     Izora Ribas, NP 2:20 PM [facility]

## 2018-04-24 ENCOUNTER — Ambulatory Visit (INDEPENDENT_AMBULATORY_CARE_PROVIDER_SITE_OTHER): Payer: Self-pay | Admitting: Adult Health

## 2018-04-24 ENCOUNTER — Encounter: Payer: Self-pay | Admitting: Adult Health

## 2018-04-24 VITALS — BP 116/84 | HR 100 | Temp 97.5°F | Ht 75.0 in | Wt 285.0 lb

## 2018-04-24 DIAGNOSIS — Z23 Encounter for immunization: Secondary | ICD-10-CM

## 2018-04-24 DIAGNOSIS — Z111 Encounter for screening for respiratory tuberculosis: Secondary | ICD-10-CM

## 2018-04-24 DIAGNOSIS — Z0000 Encounter for general adult medical examination without abnormal findings: Secondary | ICD-10-CM

## 2018-07-05 ENCOUNTER — Encounter: Payer: Self-pay | Admitting: Internal Medicine

## 2018-07-05 DIAGNOSIS — R0989 Other specified symptoms and signs involving the circulatory and respiratory systems: Secondary | ICD-10-CM | POA: Insufficient documentation

## 2018-07-05 DIAGNOSIS — E782 Mixed hyperlipidemia: Secondary | ICD-10-CM | POA: Insufficient documentation

## 2018-07-05 DIAGNOSIS — R7303 Prediabetes: Secondary | ICD-10-CM | POA: Insufficient documentation

## 2018-07-05 NOTE — Progress Notes (Signed)
NO SHOW

## 2018-07-06 ENCOUNTER — Ambulatory Visit: Payer: Self-pay | Admitting: Internal Medicine

## 2018-07-06 DIAGNOSIS — Z0000 Encounter for general adult medical examination without abnormal findings: Secondary | ICD-10-CM

## 2018-07-18 ENCOUNTER — Encounter: Payer: Self-pay | Admitting: Internal Medicine

## 2018-07-18 NOTE — Progress Notes (Signed)
[facility]   Unk Pinto, M.D.     Uvaldo Bristle. Silverio Lay, P.A.-C Liane Comber, [place]                [address] [place], [place] [address] Telephone [phone] Telefax [phone] Annual  Screening/Preventative Visit  & Comprehensive Evaluation & Examination     This very nice 48 y.o. DBM expectantly for labile elevated BP, abn Lipids,  Prediabetes and Vitamin D Deficiency. 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  Today's BP is borderline elevated - 128/88. Patient denies any cardiac symptoms as chest pain, palpitations, shortness of breath, dizziness or ankle swelling.     Patient's lipids have been mildly elevated in the past and last lipids were not at goal: Lab Results  Component Value Date   CHOL 182 04/26/2016   HDL 41 04/26/2016   LDLCALC 116 04/26/2016   TRIG 125 04/26/2016   CHOLHDL 4.4 04/26/2016      Patient has Morbid Obesity (BMI 37+)  and hx/o prediabetes (A1c 5.7% / Oct 2017) and patient denies reactive hypoglycemic symptoms, visual blurring, diabetic polys or paresthesias. Last A1c was not at goal: Lab Results  Component Value Date   HGBA1C 5.7 (H) 04/26/2016       Finally, patient has history of Vitamin D Deficiency  ("25"  / 2009) and does not supplement as recommended.  No current outpatient medications on file prior to visit.   No current facility-administered medications on file prior to visit.    No Known Allergies   History reviewed. No pertinent past medical history.   Health Maintenance  Topic Date Due  . HIV Screening  09/14/1983  . INFLUENZA VACCINE  02/15/2018  . TETANUS/TDAP  04/24/2028   Immunization History  Administered Date(s) Administered  .  PPD Test 04/24/2018  . Td 04/24/2018    History reviewed. No pertinent surgical history.   History reviewed. No pertinent family history.   Social History   Socioeconomic History  . Marital status: Divorced  Occupational History  . Part time substitute teacher  Tobacco Use  . Smoking status: Never Smoker  . Smokeless tobacco: Never Used  Substance and Sexual Activity  . Alcohol use: No  . Drug use: No  . Sexual activity: Not on file    ROS Constitutional: Denies fever, chills, weight loss/gain, headaches, insomnia,  night sweats or change in appetite. Does c/o fatigue. Eyes: Denies redness, blurred vision, diplopia, discharge, itchy or watery eyes.  ENT: Denies discharge, congestion, post nasal drip, epistaxis, sore throat, earache, hearing loss, dental pain, Tinnitus, Vertigo, Sinus pain or snoring.  Cardio: Denies chest pain, palpitations, irregular heartbeat, syncope, dyspnea, diaphoresis, orthopnea, PND, claudication or edema Respiratory: denies cough, dyspnea, DOE, pleurisy, hoarseness, laryngitis or wheezing.  Gastrointestinal: Denies dysphagia, heartburn, reflux, water brash, pain, cramps, nausea, vomiting, bloating, diarrhea, constipation, hematemesis, melena, hematochezia, jaundice or hemorrhoids Genitourinary: Denies dysuria, frequency, urgency, nocturia, hesitancy, discharge, hematuria or flank pain Musculoskeletal: Denies arthralgia, myalgia, stiffness, Jt. Swelling, pain, limp or strain/sprain. Denies Falls. Skin: Denies puritis, rash, hives, warts, acne, eczema or change in skin lesion Neuro: No weakness, tremor, incoordination, spasms, paresthesia or  pain Psychiatric: Denies confusion, memory loss or sensory loss. Denies Depression. Endocrine: Denies change in weight, skin, hair change, nocturia, and paresthesia, diabetic polys, visual blurring or hyper / hypo glycemic episodes.  Heme/Lymph: No excessive bleeding, bruising or enlarged lymph nodes.  Physical  Exam  BP 128/88   Pulse 76   Temp 97.8 F (36.6 C)   Resp 18   Ht 6\' 4"  (1.93 m)   Wt 288 lb 9.6 oz (130.9 kg)   BMI 35.13 kg/m   General Appearance: Well nourished and well groomed and in no apparent distress.  Eyes: PERRLA, EOMs, conjunctiva no swelling or erythema, normal fundi and vessels. Sinuses: No frontal/maxillary tenderness ENT/Mouth: EACs patent / TMs  nl. Nares clear without erythema, swelling, mucoid exudates. Oral hygiene is good. No erythema, swelling, or exudate. Tongue normal, non-obstructing. Tonsils not swollen or erythematous. Hearing normal.  Neck: Supple, thyroid not palpable. No bruits, nodes or JVD. Respiratory: Respiratory effort normal.  BS equal and clear bilateral without rales, rhonci, wheezing or stridor. Cardio: Heart sounds are normal with regular rate and rhythm and no murmurs, rubs or gallops. Peripheral pulses are normal and equal bilaterally without edema. No aortic or femoral bruits. Chest: symmetric with normal excursions and percussion.  Abdomen: Soft, with Nl bowel sounds. Nontender, no guarding, rebound, hernias, masses, or organomegaly.  Lymphatics: Non tender without lymphadenopathy.  Genitourinary: No hernias.Testes nl. DRE - prostate nl for age - smooth & firm w/o nodules. Musculoskeletal: Full ROM all peripheral extremities, joint stability, 5/5 strength, and normal gait. Skin: Warm and dry without rashes, lesions, cyanosis, clubbing or  ecchymosis.  Neuro: Cranial nerves intact, reflexes equal bilaterally. Normal muscle tone, no cerebellar symptoms. Sensation intact.  Pysch: Alert and oriented X 3 with normal affect, insight and judgment appropriate.   Assessment and Plan  1. Labile HTN  - encouraged random BP monitoring & call if BP > 140/90  2. HLD  - Discussed prudent low Chol / low fat diet   3 PreDiabetes  - As above discussed prudent diet         Patient was counseled in prudent diet, weight control to achieve/maintain  BMI less than 25, BP monitoring and regular exercise.Labs were declined by patient at present. Over 25 minutes of exam, counseling, chart review and decision making was performed

## 2018-07-18 NOTE — Patient Instructions (Signed)

## 2018-07-19 ENCOUNTER — Ambulatory Visit (INDEPENDENT_AMBULATORY_CARE_PROVIDER_SITE_OTHER): Payer: Self-pay | Admitting: Internal Medicine

## 2018-07-19 ENCOUNTER — Encounter: Payer: Self-pay | Admitting: Internal Medicine

## 2018-07-19 VITALS — BP 128/88 | HR 76 | Temp 97.8°F | Resp 18 | Ht 76.0 in | Wt 288.6 lb

## 2018-07-19 DIAGNOSIS — R0989 Other specified symptoms and signs involving the circulatory and respiratory systems: Secondary | ICD-10-CM

## 2018-09-24 NOTE — Progress Notes (Signed)
Assessment and Plan:  Ronald Winters was seen today for blood pressure check.  Diagnoses and all orders for this visit:  Labile hypertension  Discussed monitoring blood pressure at home.  With histroy of elevated blood pressures in the past, consider hereditary and discussed this with patient at length. Discussed goal for patient 140/90 or less. Contact patient with new or worsening symptoms.  BMI 35.0-35.9 Discussed dietary and exercise modifications.    Further disposition pending results of labs. Discussed med's effects and SE's.   Over 30 minutes of exam, counseling, chart review, and critical decision making was performed.   Future Appointments  Date Time Provider [place]  01/28/2019  2:30 PM Unk Pinto, MD [facility] None  07/26/2019 10:00 AM Unk Pinto, MD [facility] None    ------------------------------------------------------------------------------------------------------------------   HPI 48 y.o.male presents for blood pressure elevation.  He had a dentis appointment and his blood pressure was elevated and refused routine cleaning related to this.  Patient has routine physical appointment yearly with one to two acute visit a year.  He has had elevated blood pressure in the the past and related this to "white coat syndrome".   He has monitored his blood pressure when out at the store and not above normal range.   He has recently, past two months drastically cut his sugars down and no longer drinks sweet tea. Reports he has increased his walking.   Denies adding salt to foods and does not eat prepackaged foods.  He has increased his exercise and goes to the gym 5 days a week and walks.runs around the neighborhood on remaining days.   No past medical history on file.   No Known Allergies  No current outpatient medications on file prior to visit.   No current facility-administered medications on file prior to visit.     ROS: all negative except above.    Physical Exam:  BP (!) 140/99   Pulse 85   Temp (!) 97.3 F (36.3 C)   Ht 6\' 4"  (1.93 m)   Wt 295 lb (133.8 kg)   SpO2 99%   BMI 35.91 kg/m   General Appearance: Well nourished, in no apparent distress. Eyes: PERRLA, EOMs, conjunctiva no swelling or erythema Sinuses: No Frontal/maxillary tenderness ENT/Mouth: Ext aud canals clear, TMs without erythema, bulging. No erythema, swelling, or exudate on post pharynx.  Tonsils not swollen or erythematous. Hearing normal.  Neck: Supple, thyroid normal.  Respiratory: Respiratory effort normal, BS equal bilaterally without rales, rhonchi, wheezing or stridor.  Cardio: RRR with no MRGs. Brisk peripheral pulses without edema.  Abdomen: Soft, + BS.  Non tender, no guarding, rebound, hernias, masses. Lymphatics: Non tender without lymphadenopathy.  Musculoskeletal: Full ROM, 5/5 strength, normal gait.  Skin: Warm, dry without rashes, lesions, ecchymosis.  Neuro: Cranial nerves intact. Normal muscle tone, no cerebellar symptoms. Sensation intact.  Psych: Awake and oriented X 3, normal affect, Insight and Judgment appropriate.     Ronald Sierras, NP 12:28 AM [facility]

## 2018-09-25 ENCOUNTER — Ambulatory Visit (INDEPENDENT_AMBULATORY_CARE_PROVIDER_SITE_OTHER): Payer: Self-pay | Admitting: Adult Health Nurse Practitioner

## 2018-09-25 ENCOUNTER — Encounter: Payer: Self-pay | Admitting: Adult Health Nurse Practitioner

## 2018-09-25 VITALS — BP 140/99 | HR 85 | Temp 97.3°F | Ht 76.0 in | Wt 295.0 lb

## 2018-09-25 DIAGNOSIS — Z6835 Body mass index (BMI) 35.0-35.9, adult: Secondary | ICD-10-CM

## 2018-09-25 DIAGNOSIS — R0989 Other specified symptoms and signs involving the circulatory and respiratory systems: Secondary | ICD-10-CM

## 2018-09-25 NOTE — Patient Instructions (Addendum)
Goal for you is 140/90 or less  Watch salt in your diet and continue to eat health and increase your exercise.  Stop into a local pharmacy and check your blood pressure IF this remains elevated please contact out office for an appointment.  Goal 140/90 or less.

## 2018-10-01 ENCOUNTER — Encounter: Payer: Self-pay | Admitting: Adult Health Nurse Practitioner

## 2018-10-01 DIAGNOSIS — Z6835 Body mass index (BMI) 35.0-35.9, adult: Secondary | ICD-10-CM | POA: Insufficient documentation

## 2019-01-28 ENCOUNTER — Ambulatory Visit: Payer: Self-pay | Admitting: Internal Medicine

## 2019-01-28 NOTE — Progress Notes (Signed)
RESCHEDULED

## 2019-07-26 ENCOUNTER — Encounter: Payer: Self-pay | Admitting: Internal Medicine

## 2020-08-10 NOTE — Progress Notes (Signed)
    N  O      S  H  O  W                                                                                                                                                                                                                                                                                                                                                                  This very nice 50 y.o.  DBM is followed for labile HTN, HLD, Pre-Diabetes and Vitamin D Deficiency. Patient has deferred having any lab work done for a couple of years due to lack of insurance.       Patient is monitored for elevated BP and today's  . Patient has had no complaints of any cardiac type chest pain, palpitations, dyspnea / orthopnea / PND, dizziness, claudication, or dependent edema.      Hyperlipidemia is controlled with diet & meds. Patient denies myalgias or other med SE's. Last Lipids were at goal:  Lab Results  Component Value Date   CHOL 182 04/26/2016   HDL 41 04/26/2016   LDLCALC 116 04/26/2016   TRIG 125 04/26/2016   CHOLHDL 4.4 04/26/2016    Also, the patient has Morbid Obesity (BMI 37+)  and hx/o prediabetes (A1c 5.7% /2017) and has had no symptoms of reactive hypoglycemia, diabetic polys, paresthesias or visual blurring.  Last A1c was   Lab Results  Component Value Date   HGBA1C 5.7 (H) 04/26/2016          Further, the patient also has history of Vitamin D Deficiency ("25"/2009) and supplements vitamin D without any suspected side-effects.

## 2020-08-11 ENCOUNTER — Ambulatory Visit: Payer: Self-pay | Admitting: Internal Medicine

## 2020-08-11 DIAGNOSIS — E782 Mixed hyperlipidemia: Secondary | ICD-10-CM

## 2020-08-11 DIAGNOSIS — R0989 Other specified symptoms and signs involving the circulatory and respiratory systems: Secondary | ICD-10-CM

## 2020-08-11 DIAGNOSIS — E559 Vitamin D deficiency, unspecified: Secondary | ICD-10-CM

## 2020-08-11 DIAGNOSIS — Z79899 Other long term (current) drug therapy: Secondary | ICD-10-CM

## 2020-08-11 DIAGNOSIS — R7309 Other abnormal glucose: Secondary | ICD-10-CM

## 2020-12-09 ENCOUNTER — Other Ambulatory Visit: Payer: Self-pay

## 2020-12-09 ENCOUNTER — Encounter: Payer: Self-pay | Admitting: Internal Medicine

## 2020-12-09 ENCOUNTER — Ambulatory Visit: Payer: Self-pay | Admitting: Internal Medicine

## 2020-12-09 VITALS — BP 122/90 | HR 78 | Temp 97.7°F | Resp 18 | Ht 76.0 in | Wt 290.0 lb

## 2020-12-09 DIAGNOSIS — M791 Myalgia, unspecified site: Secondary | ICD-10-CM

## 2020-12-09 DIAGNOSIS — R0989 Other specified symptoms and signs involving the circulatory and respiratory systems: Secondary | ICD-10-CM

## 2020-12-09 MED ORDER — MELOXICAM 15 MG PO TABS: ORAL_TABLET | ORAL | 3 refills | Status: DC

## 2020-12-09 NOTE — Progress Notes (Signed)
Future Appointments  Date Time Provider [place]  12/09/2020  2:00 PM Unk Pinto, MD [facility] None  12/09/2021  2:00 PM Unk Pinto, MD [facility] None            This very nice 50 y.o.  DBM presents for an evaluation and management of multiple medical co-morbidities.  Patient has been followed for for labile HTN, HLD, Pre-Diabetes and Vitamin D Deficiency       Patient is monitored for elevated BP . Patient's BP has been controlled at home.  Today's BP is at goal with borderline high normal  122/90. Patient denies any cardiac symptoms as chest pain, palpitations, shortness of breath, dizziness or ankle swelling.       Patient's hyperlipidemia is controlled with diet and medications in the past , but he has been Patient denies myalgias or other medication SE's. Last lipids were not at goal:  Lab Results  Component Value Date   CHOL 182 04/26/2016   HDL 41 04/26/2016   LDLCALC 116 04/26/2016   TRIG 125 04/26/2016   CHOLHDL 4.4 04/26/2016    No Known Allergies   Health Maintenance  Topic Date Due  . COVID-19 Vaccine (1) Never done  . HIV Screening  Never done  . Hepatitis C Screening  Never done  . COLONOSCOPY Never done  . INFLUENZA VACCINE  02/15/2021  . TETANUS/TDAP  04/24/2028  . HPV VACCINES  Aged Out    Immunization History  Administered Date(s) Administered  . PPD Test 04/24/2018  . Td 04/24/2018    Social History   Socioeconomic History  . Marital status: Divorced  . Number of children: None  Occupational History  . 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 Smoking status: Never Smoker  . Smokeless tobacco: Never Used  Substance and Sexual Activity  . Alcohol use: No    Alcohol/week: 0.0 standard drinks  . Drug use: No     ROS Constitutional: Denies fever, chills, weight loss/gain, headaches, insomnia,  night sweats or change in appetite. Does c/o fatigue. Eyes: Denies redness, blurred vision, diplopia,  discharge, itchy or watery eyes.  ENT: Denies discharge, congestion, post nasal drip, epistaxis, sore throat, earache, hearing loss, dental pain, Tinnitus, Vertigo, Sinus pain or snoring.  Cardio: Denies chest pain, palpitations, irregular heartbeat, syncope, dyspnea, diaphoresis, orthopnea, PND, claudication or edema Respiratory: denies cough, dyspnea, DOE, pleurisy, hoarseness, laryngitis or wheezing.  Gastrointestinal: Denies dysphagia, heartburn, reflux, water brash, pain, cramps, nausea, vomiting, bloating, diarrhea, constipation, hematemesis, melena, hematochezia, jaundice or hemorrhoids Genitourinary: Denies dysuria, frequency, urgency, nocturia, hesitancy, discharge, hematuria or flank pain Musculoskeletal: Denies arthralgia, myalgia, stiffness, Jt. Swelling, pain, limp or strain/sprain. Denies Falls. Skin: Denies puritis, rash, hives, warts, acne, eczema or change in skin lesion Neuro: No weakness, tremor, incoordination, spasms, paresthesia or pain Psychiatric: Denies confusion, memory loss or sensory loss. Denies Depression. Endocrine: Denies change in weight, skin, hair change, nocturia, and paresthesia, diabetic polys, visual blurring or hyper / hypo glycemic episodes.  Heme/Lymph: No excessive bleeding, bruising or enlarged lymph nodes.   Physical Exam  BP 122/90   Pulse 78   Temp 97.7 F (36.5 C)   Resp 18   Ht 6\' 4"  (1.93 m)   Wt 290 lb (131.5 kg)   SpO2 98%   BMI 35.30 kg/m   General Appearance: Well nourished and well groomed and in no apparent distress.  Eyes: PERRLA, EOMs, conjunctiva no swelling or erythema, normal fundi and vessels.  Sinuses: No frontal/maxillary tenderness ENT/Mouth: EACs patent / TMs  nl. Nares clear without erythema, swelling, mucoid exudates. Oral hygiene is good. No erythema, swelling, or exudate. Tongue normal, non-obstructing. Tonsils not swollen or erythematous. Hearing normal.  Neck: Supple, thyroid not palpable. No bruits, nodes or  JVD. Respiratory: Respiratory effort normal.  BS equal and clear bilateral without rales, rhonci, wheezing or stridor. Cardio: Heart sounds are normal with regular rate and rhythm and no murmurs, rubs or gallops. Peripheral pulses are normal and equal bilaterally without edema. No aortic or femoral bruits. Chest: symmetric with normal excursions and percussion.  Abdomen: Soft, with Nl bowel sounds. Nontender, no guarding, rebound, hernias, masses, or organomegaly.  Lymphatics: Non tender without lymphadenopathy.  Musculoskeletal: Full ROM all peripheral extremities, joint stability, 5/5 strength, and normal gait. Skin: Warm and dry without rashes, lesions, cyanosis, clubbing or  ecchymosis.  Neuro: Cranial nerves intact, reflexes equal bilaterally. Normal muscle tone, no cerebellar symptoms. Sensation intact.  Pysch: Alert and oriented X 3 with normal affect, insight and judgment appropriate.   Assessment and Plan  1. Labile hypertension  - Advised monitoring his BP   2. Myalgia  - meloxicam 15 MG tablet; Take 1/2 to 1 tablet Daily at supper     Dis: 90 tab; Rf: 3           Patient was counseled in prudent diet, weight control to achieve/maintain BMI less than 25, BP monitoring,  and regular exercise.  Discussed med effects and SE's. Deferred labs until heOver 40 minutes of exam, counseling, chart review and high complex critical decision making was performed   Kirtland Bouchard, MD

## 2021-04-06 NOTE — Progress Notes (Signed)
FOLLOW UP  Assessment and Plan:    Ronald Winters was seen today for acute visit.  Diagnoses and all orders for this visit:  Hyperlipidemia, mixed -     TSH - No medication.  Is following low saturated fat diet and has exercise program  Chronic fatigue -     CBC with Differential/Platelet - Continue multivitamin, regular sleep schedule, exercise - Will return in January for CPE because will have his insurance at that time  BMI 35.0-35.9,adult   Continue diet and exercise   Continue diet and meds as discussed. Further disposition pending results of labs. Discussed med's effects and SE's.   Over 30 minutes of exam, counseling, chart review, and critical decision making was performed.   Future Appointments  Date Time Provider [place]  04/07/2021  4:00 PM Magda Bernheim, NP [facility] None  12/09/2021  2:00 PM Unk Pinto, MD [facility] None    ----------------------------------------------------------------------------------------------------------------------  HPI 50 y.o. male  presents for chronic fatigue and swelling of extremities and evaluation of hypertension, cholesterol, diabetes, weight and vitamin D deficiency.  Pt has not had lab work since 2019, lipids 2017  Pt has been noticing extreme fatigue 2-3 times a month. Occurs when he wakes up is feeling extremely fatigued.  No trouble falling asleep. Will occasionally wake up 1-2 times a night.   Currently he is a Licensed conveyancer at [employer]. Will hopefully have health insurance in January.  BMI is Body mass index is 35.62 kg/m., he has not been working on diet and exercise. Wt Readings from Last 3 Encounters:  04/07/21 292 lb 9.6 oz (132.7 kg)  12/09/20 290 lb (131.5 kg)  09/25/18 295 lb (133.8 kg)    His blood pressure has been controlled at home 120/70, today their BP is BP: 100/70  He does workout. He denies chest pain, shortness of breath, dizziness.   He is not on cholesterol medication . His cholesterol is  not at goal. The cholesterol last visit was:   Lab Results  Component Value Date   CHOL 182 04/26/2016   HDL 41 04/26/2016   LDLCALC 116 04/26/2016   TRIG 125 04/26/2016   CHOLHDL 4.4 04/26/2016      Current Medications:  Current Outpatient Medications on File Prior to Visit  Medication Sig   Ascorbic Acid (VITAMIN C PO) Take 1 tablet by mouth. PRN   aspirin EC 81 MG tablet Take 81 mg by mouth. Swallow whole. PRN   Multiple Vitamin (MULTIVITAMIN) tablet Take 1 tablet by mouth daily.   VITAMIN D PO Take 1 capsule by mouth. PRN   meloxicam (MOBIC) 15 MG tablet Take 1/2 to 1 tablet Daily at supper   for Pain & Inflammation   No current facility-administered medications on file prior to visit.     Allergies: No Known Allergies   Medical History: No past medical history on file. Family history- Reviewed and unchanged Social history- Reviewed and unchanged   Review of Systems:  Review of Systems  Constitutional:  Positive for malaise/fatigue (3 days a month). Negative for chills and fever.  HENT:  Negative for congestion, hearing loss, sinus pain, sore throat and tinnitus.   Eyes:  Negative for blurred vision and double vision.  Respiratory:  Negative for cough, hemoptysis, sputum production, shortness of breath and wheezing.   Cardiovascular:  Negative for chest pain, palpitations and leg swelling.  Gastrointestinal:  Negative for abdominal pain, constipation, diarrhea, heartburn, nausea and vomiting.  Genitourinary:  Negative for dysuria and urgency.  Musculoskeletal:  Negative for back pain, falls, joint pain, myalgias and neck pain.       Joint stiffness of fingers intermittently  Skin:  Negative for rash.  Neurological:  Negative for dizziness, tingling, tremors, weakness and headaches.  Endo/Heme/Allergies:  Does not bruise/bleed easily.  Psychiatric/Behavioral:  Negative for depression and suicidal ideas. The patient is not nervous/anxious and does not have insomnia.       Physical Exam: BP 100/70   Pulse 64   Temp 97.7 F (36.5 C)   Wt 292 lb 9.6 oz (132.7 kg)   SpO2 98%   BMI 35.62 kg/m  Wt Readings from Last 3 Encounters:  04/07/21 292 lb 9.6 oz (132.7 kg)  12/09/20 290 lb (131.5 kg)  09/25/18 295 lb (133.8 kg)   General Appearance: Well nourished, in no apparent distress. Eyes: PERRLA, EOMs, conjunctiva no swelling or erythema Sinuses: No Frontal/maxillary tenderness ENT/Mouth: Ext aud canals clear, TMs without erythema, bulging. No erythema, swelling, or exudate on post pharynx.  Tonsils not swollen or erythematous. Hearing normal.  Neck: Supple, thyroid normal.  Respiratory: Respiratory effort normal, BS equal bilaterally without rales, rhonchi, wheezing or stridor.  Cardio: RRR with no MRGs. Brisk peripheral pulses without edema.  Abdomen: Soft, + BS.  Non tender, no guarding, rebound, hernias, masses. Lymphatics: Non tender without lymphadenopathy.  Musculoskeletal: Full ROM, 5/5 strength, Normal gait Skin: Warm, dry without rashes, lesions, ecchymosis.  Neuro: Cranial nerves intact. No cerebellar symptoms.  Psych: Awake and oriented X 3, normal affect, Insight and Judgment appropriate.    Magda Bernheim, NP 3:54 PM [facility]

## 2021-04-07 ENCOUNTER — Encounter: Payer: Self-pay | Admitting: Nurse Practitioner

## 2021-04-07 ENCOUNTER — Other Ambulatory Visit: Payer: Self-pay

## 2021-04-07 ENCOUNTER — Ambulatory Visit (INDEPENDENT_AMBULATORY_CARE_PROVIDER_SITE_OTHER): Payer: Self-pay | Admitting: Nurse Practitioner

## 2021-04-07 VITALS — BP 100/70 | HR 64 | Temp 97.7°F | Wt 292.6 lb

## 2021-04-07 DIAGNOSIS — R5382 Chronic fatigue, unspecified: Secondary | ICD-10-CM

## 2021-04-07 DIAGNOSIS — R0989 Other specified symptoms and signs involving the circulatory and respiratory systems: Secondary | ICD-10-CM

## 2021-04-07 DIAGNOSIS — E559 Vitamin D deficiency, unspecified: Secondary | ICD-10-CM

## 2021-04-07 DIAGNOSIS — E782 Mixed hyperlipidemia: Secondary | ICD-10-CM

## 2021-04-07 DIAGNOSIS — M791 Myalgia, unspecified site: Secondary | ICD-10-CM

## 2021-04-07 DIAGNOSIS — R7309 Other abnormal glucose: Secondary | ICD-10-CM

## 2021-04-07 DIAGNOSIS — Z6835 Body mass index (BMI) 35.0-35.9, adult: Secondary | ICD-10-CM

## 2021-04-07 LAB — CBC WITH DIFFERENTIAL/PLATELET
Absolute Monocytes: 439 cells/uL (ref 200–950)
Basophils Absolute: 41 cells/uL (ref 0–200)
Basophils Relative: 1 %
Eosinophils Absolute: 308 cells/uL (ref 15–500)
Eosinophils Relative: 7.5 %
HCT: 45 % (ref 38.5–50.0)
Hemoglobin: 14.7 g/dL (ref 13.2–17.1)
Lymphs Abs: 1697 cells/uL (ref 850–3900)
MCH: 26.5 pg — ABNORMAL LOW (ref 27.0–33.0)
MCHC: 32.7 g/dL (ref 32.0–36.0)
MCV: 81.1 fL (ref 80.0–100.0)
MPV: 10.7 fL (ref 7.5–12.5)
Monocytes Relative: 10.7 %
Neutro Abs: 1615 cells/uL (ref 1500–7800)
Neutrophils Relative %: 39.4 %
Platelets: 221 10*3/uL (ref 140–400)
RBC: 5.55 10*6/uL (ref 4.20–5.80)
RDW: 13.1 % (ref 11.0–15.0)
Total Lymphocyte: 41.4 %
WBC: 4.1 10*3/uL (ref 3.8–10.8)

## 2021-04-07 LAB — TSH: TSH: 1.68 mIU/L (ref 0.40–4.50)

## 2021-04-07 NOTE — Patient Instructions (Signed)

## 2021-08-13 ENCOUNTER — Encounter: Payer: Self-pay | Admitting: Internal Medicine

## 2021-12-08 NOTE — Patient Instructions (Signed)

## 2021-12-08 NOTE — Progress Notes (Signed)
\  Future Appointments  Date Time Provider Department  12/09/2021  2:00 PM Unk Pinto, MD [facility]  12/20/2022  2:00 PM Unk Pinto, MD [facility]            This very nice 51 y.o. DBM  has been followed for labile HTN, abnormal lipids, Prediabetes and Vitamin D Deficiency.       Patient is monitored for elevated BP . Patient's BP has been controlled and today's BP is borderline - high normal diastolic - 671/24. Patient denies any cardiac symptoms as chest pain, palpitations, shortness of breath, dizziness or ankle swelling.       Patient's hyperlipidemia is controlled with diet. Last lipids were not at goal :  Lab Results  Component Value Date   CHOL 182 04/26/2016   HDL 41 04/26/2016   LDLCALC 116 04/26/2016   TRIG 125 04/26/2016   CHOLHDL 4.4 04/26/2016            Patient has Morbid Obesity (BMI 37+)  and hx/o prediabetes (A1c 5.7% / Oct 2017) and patient denies reactive hypoglycemic symptoms, visual blurring, diabetic polys or paresthesias. Last A1c was near goal :   Lab Results  Component Value Date   HGBA1C 5.7 (H) 04/26/2016          Finally, patient has history of Vitamin D Deficiency ("25"  /2009) .   Current Outpatient Medications on File Prior to Visit  Medication Sig   Ascorbic Acid (VITAMIN C PO) Take 1 tablet by mouth. PRN   aspirin EC 81 MG tablet Take 81 mg by mouth. Swallow whole. PRN   Multiple Vitamin (MULTIVITAMIN) tablet Take 1 tablet by mouth daily.   VITAMIN D PO Take 1 capsule by mouth. PRN   No current facility-administered medications on file prior to visit.    No Known Allergies   No past medical history on file.   Health Maintenance  Topic Date Due   COVID-19 Vaccine (1) Never done   HIV Screening  Never done   Hepatitis C Screening  Never done   Zoster Vaccines- Shingrix (1 of 2) Never done   INFLUENZA VACCINE  02/15/2022   TETANUS/TDAP  04/24/2028   HPV VACCINES  Aged Out     Immunization History   Administered Date(s) Administered   PPD Test 04/24/2018   Td 04/24/2018    Last Colon - never  No past surgical history on file.   No family history on file.   Social History   Tobacco Use   Smoking status: Never   Smokeless tobacco: Never  Substance Use Topics   Alcohol use: No    Alcohol/week: 0.0 standard drinks   Drug use: No      ROS Constitutional: Denies fever, chills, weight loss/gain, headaches, insomnia,  night sweats or change in appetite. Does c/o fatigue. Eyes: Denies redness, blurred vision, diplopia, discharge, itchy or watery eyes.  ENT: Denies discharge, congestion, post nasal drip, epistaxis, sore throat, earache, hearing loss, dental pain, Tinnitus, Vertigo, Sinus pain or snoring.  Cardio: Denies chest pain, palpitations, irregular heartbeat, syncope, dyspnea, diaphoresis, orthopnea, PND, claudication or edema Respiratory: denies cough, dyspnea, DOE, pleurisy, hoarseness, laryngitis or wheezing.  Gastrointestinal: Denies dysphagia, heartburn, reflux, water brash, pain, cramps, nausea, vomiting, bloating, diarrhea, constipation, hematemesis, melena, hematochezia, jaundice or hemorrhoids Genitourinary: Denies dysuria, frequency, urgency, nocturia, hesitancy, discharge, hematuria or flank pain Musculoskeletal: Denies arthralgia, myalgia, stiffness, Jt. Swelling, pain, limp or strain/sprain. Denies Falls. Skin: Denies puritis, rash, hives, warts, acne, eczema or change  in skin lesion Neuro: No weakness, tremor, incoordination, spasms, paresthesia or pain Psychiatric: Denies confusion, memory loss or sensory loss. Denies Depression. Endocrine: Denies change in weight, skin, hair change, nocturia, and paresthesia, diabetic polys, visual blurring or hyper / hypo glycemic episodes.  Heme/Lymph: No excessive bleeding, bruising or enlarged lymph nodes.   Physical Exam  BP 130/90   Pulse 63   Temp (!) 96.9 F (36.1 C)   Ht '6\' 3"'$  (1.905 m)   Wt 289 lb (131.1  kg)   SpO2 98%   BMI 36.12 kg/m   General Appearance: Over nourished and in no apparent distress.  Eyes: PERRLA, EOMs, conjunctiva no swelling or erythema, normal fundi and vessels. Sinuses: No frontal/maxillary tenderness ENT/Mouth: EACs patent / TMs  nl. Nares clear without erythema, swelling, mucoid exudates. Oral hygiene is good. No erythema, swelling, or exudate. Tongue normal, non-obstructing. Tonsils not swollen or erythematous. Hearing normal.  Neck: Supple, thyroid not palpable. No bruits, nodes or JVD. Respiratory: Respiratory effort normal.  BS equal and clear bilateral without rales, rhonci, wheezing or stridor. Cardio: Heart sounds are normal with regular rate and rhythm and no murmurs, rubs or gallops. Peripheral pulses are normal and equal bilaterally without edema. No aortic or femoral bruits. Chest: symmetric with normal excursions and percussion.  Abdomen: Soft, with Nl bowel sounds. Nontender, no guarding, rebound, hernias, masses, or organomegaly.  Lymphatics: Non tender without lymphadenopathy.  Musculoskeletal: Full ROM all peripheral extremities, joint stability, 5/5 strength, and normal gait. Skin: Warm and dry without rashes, lesions, cyanosis, clubbing or  ecchymosis.  Neuro: Cranial nerves intact, reflexes equal bilaterally. Normal muscle tone, no cerebellar symptoms. Sensation intact.  Pysch: Alert and oriented X 3 with normal affect, insight and judgment appropriate.   Assessment and Plan  1. Labile hypertension  2. Hyperlipidemia, mixed  3. Abnormal glucose  4. Vitamin D deficiency        Patient was counseled in prudent diet, weight control to achieve/maintain BMI less than 25, BP monitoring, regular exercise and medications as discussed.  Discussed med effects and SE's. Routine screening labs ,  recommended colonoscopy and tests deferred by patient due to uninsured  status. Over 25 minutes of exam, counseling, chart review and critical decision making  was performed   Kirtland Bouchard, MD

## 2021-12-09 ENCOUNTER — Ambulatory Visit: Payer: Self-pay | Admitting: Internal Medicine

## 2021-12-09 ENCOUNTER — Encounter: Payer: Self-pay | Admitting: Internal Medicine

## 2021-12-09 VITALS — BP 130/90 | HR 63 | Temp 96.9°F | Ht 75.0 in | Wt 289.0 lb

## 2021-12-09 DIAGNOSIS — R030 Elevated blood-pressure reading, without diagnosis of hypertension: Secondary | ICD-10-CM

## 2021-12-09 DIAGNOSIS — R0989 Other specified symptoms and signs involving the circulatory and respiratory systems: Secondary | ICD-10-CM

## 2021-12-09 DIAGNOSIS — R7309 Other abnormal glucose: Secondary | ICD-10-CM

## 2021-12-09 DIAGNOSIS — R5383 Other fatigue: Secondary | ICD-10-CM

## 2021-12-09 DIAGNOSIS — Z1211 Encounter for screening for malignant neoplasm of colon: Secondary | ICD-10-CM

## 2021-12-09 DIAGNOSIS — E559 Vitamin D deficiency, unspecified: Secondary | ICD-10-CM

## 2021-12-09 DIAGNOSIS — Z125 Encounter for screening for malignant neoplasm of prostate: Secondary | ICD-10-CM

## 2021-12-09 DIAGNOSIS — E782 Mixed hyperlipidemia: Secondary | ICD-10-CM

## 2021-12-09 DIAGNOSIS — Z136 Encounter for screening for cardiovascular disorders: Secondary | ICD-10-CM

## 2021-12-09 DIAGNOSIS — Z111 Encounter for screening for respiratory tuberculosis: Secondary | ICD-10-CM

## 2021-12-09 DIAGNOSIS — Z0001 Encounter for general adult medical examination with abnormal findings: Secondary | ICD-10-CM

## 2021-12-09 DIAGNOSIS — R7303 Prediabetes: Secondary | ICD-10-CM

## 2021-12-09 DIAGNOSIS — Z79899 Other long term (current) drug therapy: Secondary | ICD-10-CM

## 2021-12-12 ENCOUNTER — Encounter: Payer: Self-pay | Admitting: Internal Medicine

## 2021-12-20 ENCOUNTER — Encounter: Payer: Self-pay | Admitting: Internal Medicine

## 2022-12-20 ENCOUNTER — Encounter: Payer: BLUE CROSS/BLUE SHIELD | Admitting: Internal Medicine

## 2023-01-10 ENCOUNTER — Encounter: Payer: BLUE CROSS/BLUE SHIELD | Admitting: Nurse Practitioner

## 2023-08-29 ENCOUNTER — Encounter: Payer: Self-pay | Admitting: *Deleted

## 2024-01-10 ENCOUNTER — Encounter: Payer: BLUE CROSS/BLUE SHIELD | Admitting: Nurse Practitioner
